# Patient Record
Sex: Female | Born: 1945 | Race: White | Hispanic: No | Marital: Married
Health system: Southern US, Community
[De-identification: ages and names within clinical notes are randomized; demographics above are authoritative.]

## PROBLEM LIST (undated history)

## (undated) DIAGNOSIS — M069 Rheumatoid arthritis, unspecified: Secondary | ICD-10-CM

## (undated) DIAGNOSIS — F0150 Vascular dementia without behavioral disturbance: Secondary | ICD-10-CM

## (undated) DIAGNOSIS — F32A Depression, unspecified: Secondary | ICD-10-CM

## (undated) DIAGNOSIS — E785 Hyperlipidemia, unspecified: Secondary | ICD-10-CM

## (undated) DIAGNOSIS — F419 Anxiety disorder, unspecified: Secondary | ICD-10-CM

## (undated) DIAGNOSIS — I69319 Unspecified symptoms and signs involving cognitive functions following cerebral infarction: Secondary | ICD-10-CM

## (undated) DIAGNOSIS — I10 Essential (primary) hypertension: Secondary | ICD-10-CM

## (undated) DIAGNOSIS — F329 Major depressive disorder, single episode, unspecified: Secondary | ICD-10-CM

---
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
## 2005-02-24 ENCOUNTER — Ambulatory Visit: Payer: Self-pay | Admitting: Family Medicine

## 2005-04-24 IMAGING — US ABDOMEN ULTRASOUND
1 series · 14 of 25 positions shown · non-contrast
Comparison: none

REASON FOR EXAM: Abdominal pain
COMMENTS:

[Series 1: abdomen ultrasound · 0.35mm/px · 14 of 54 slices shown]
[im 1/54]
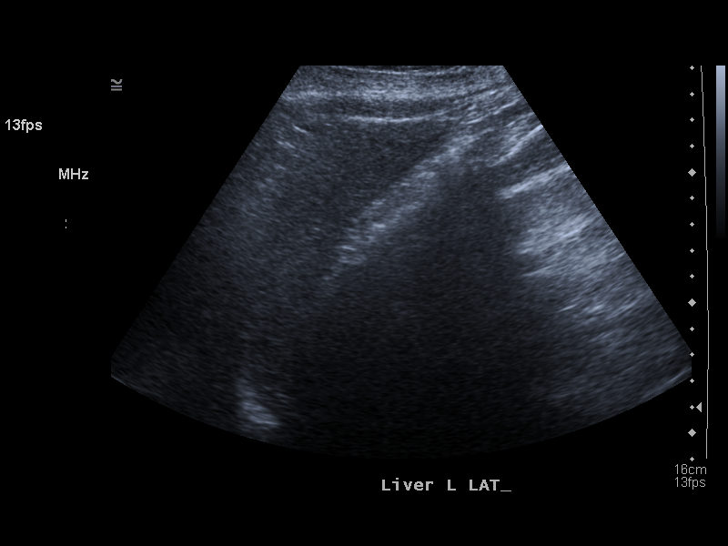
[im 5/54]
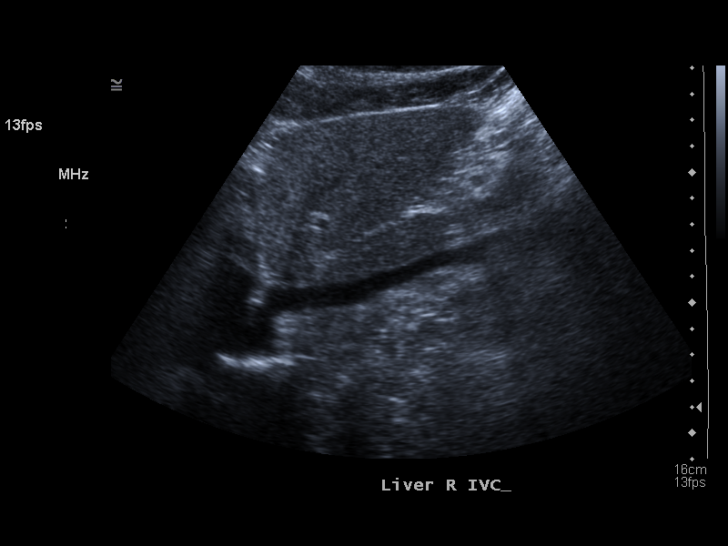
[im 9/54]
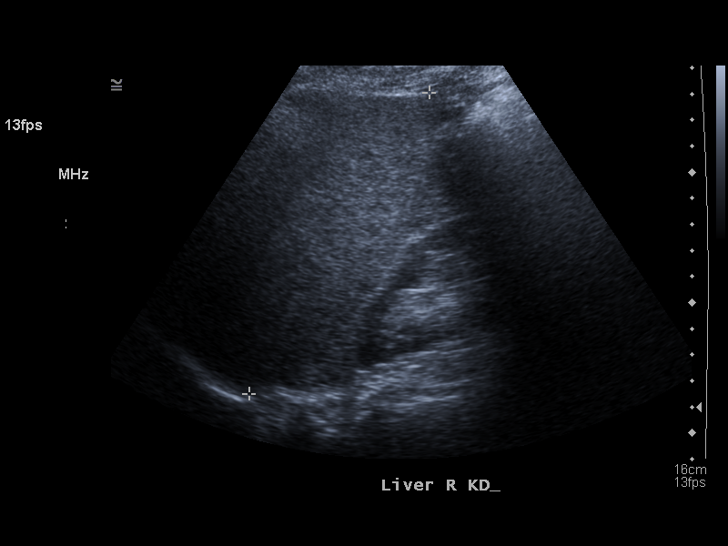
[im 14/54]
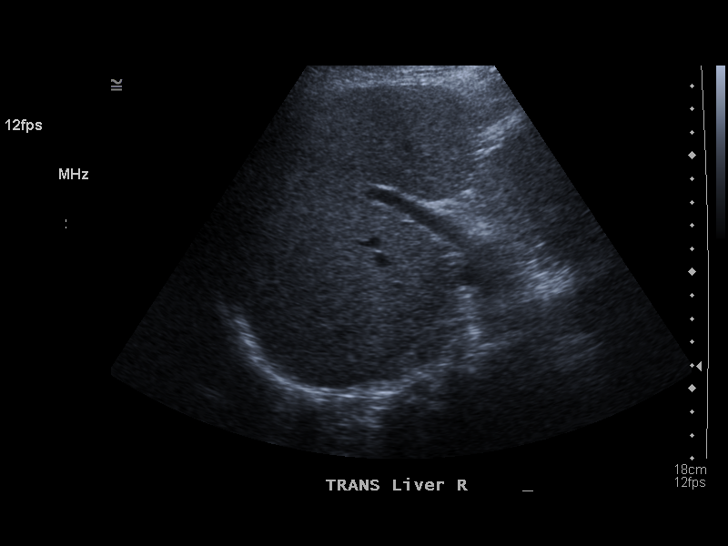
[im 18/54]
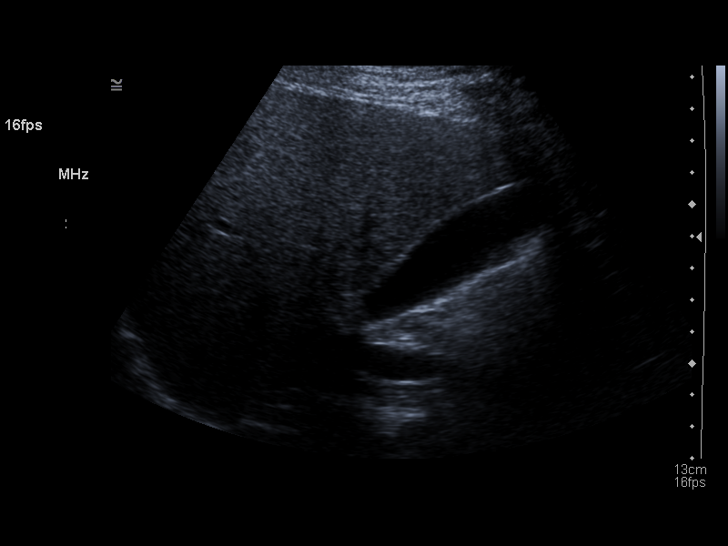
[im 20/54]
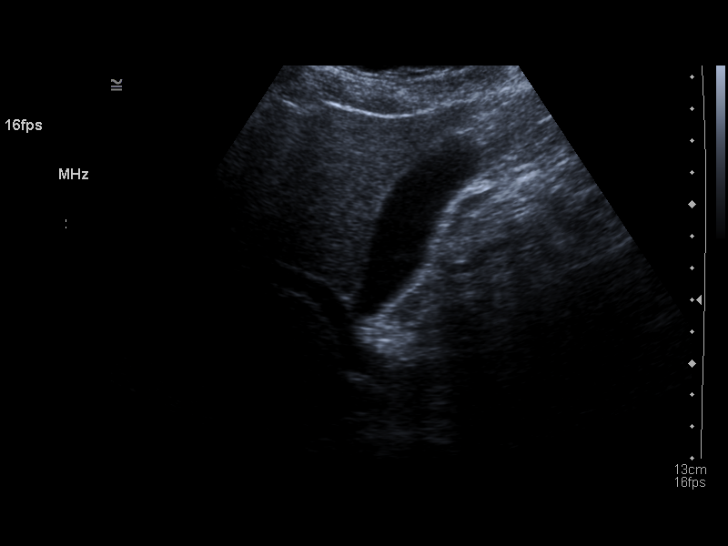
[im 25/54]
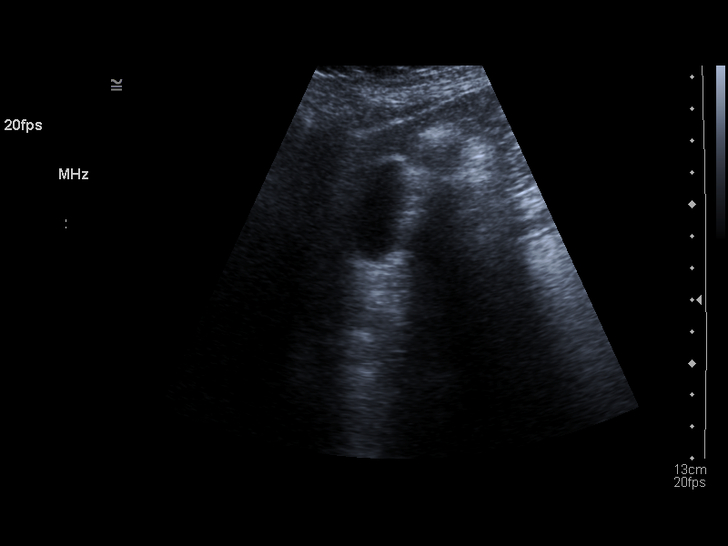
[im 29/54]
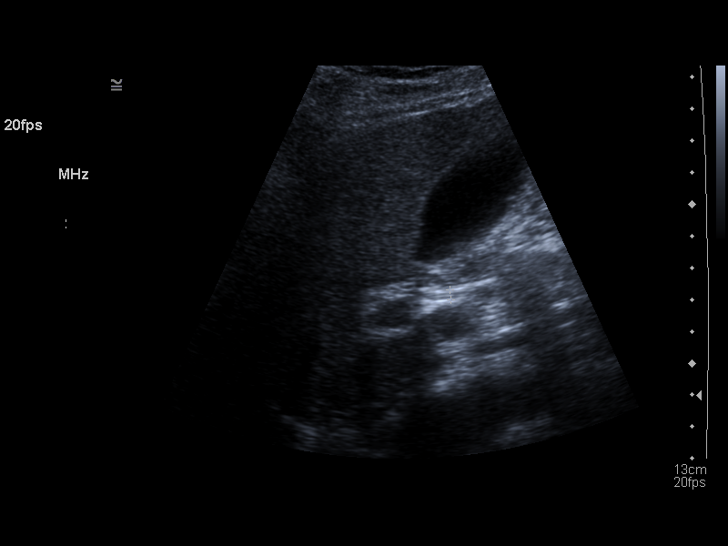
[im 34/54]
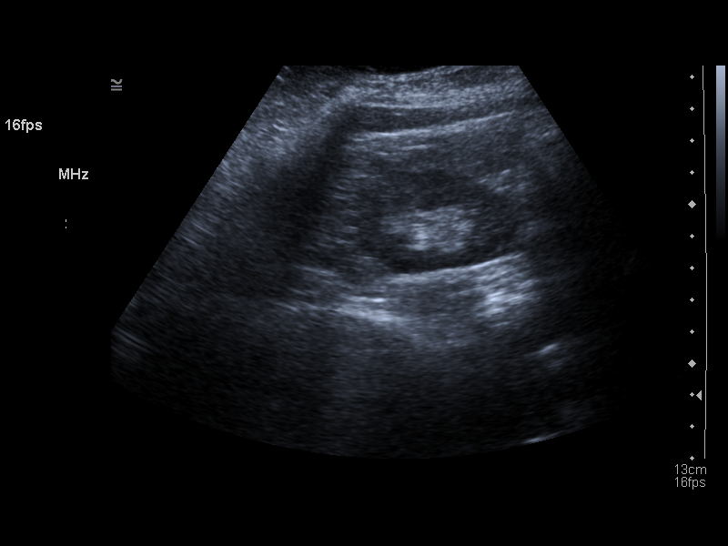
[im 36/54]
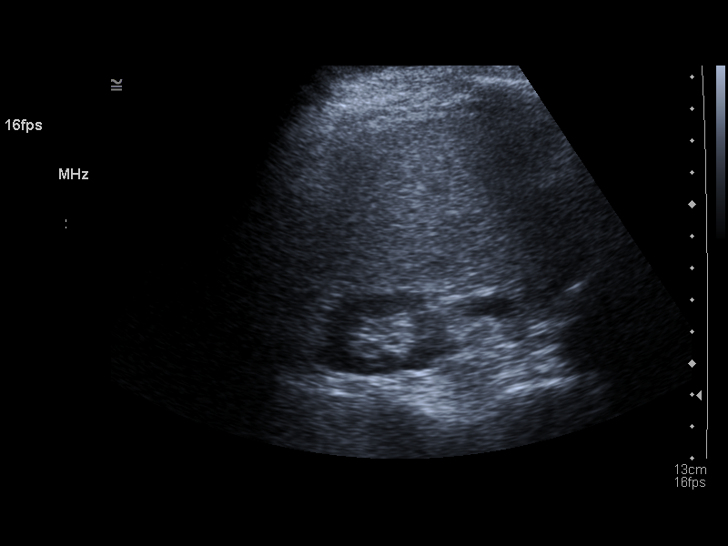
[im 40/54]
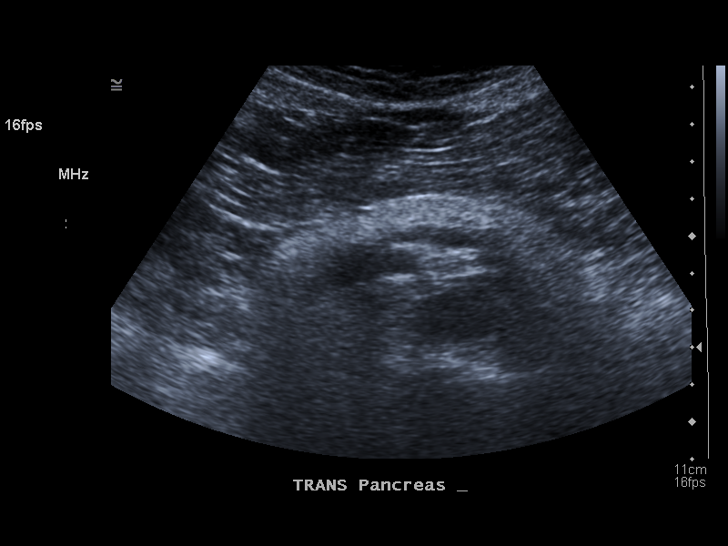
[im 45/54]
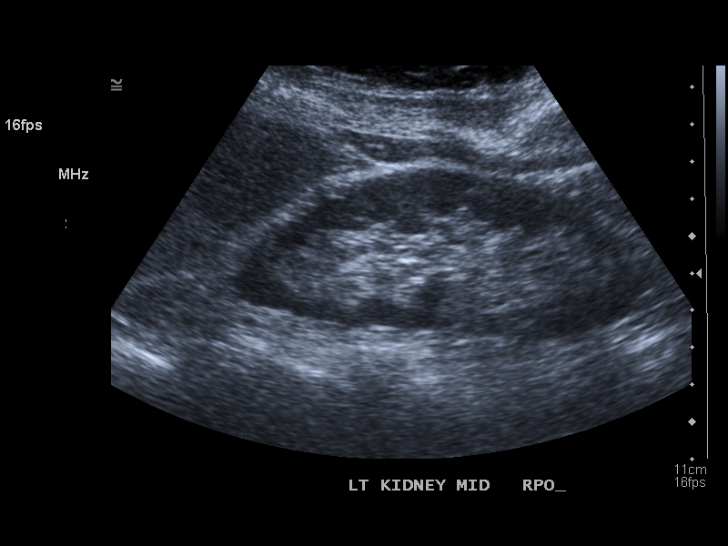
[im 49/54]
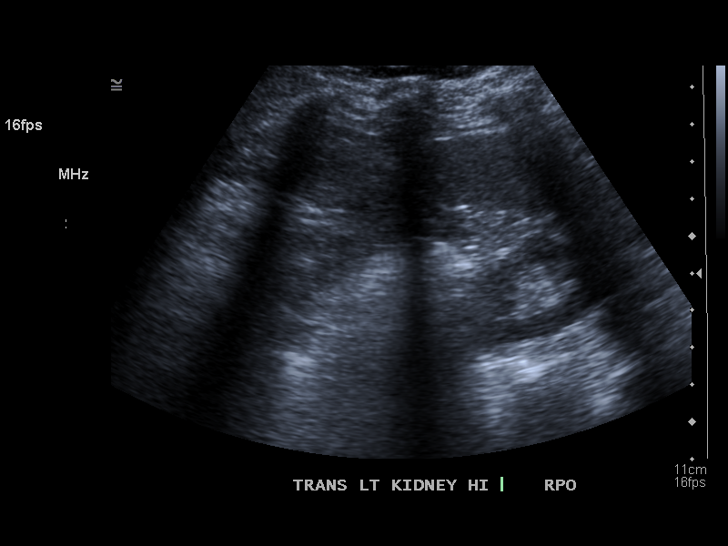
[im 54/54]
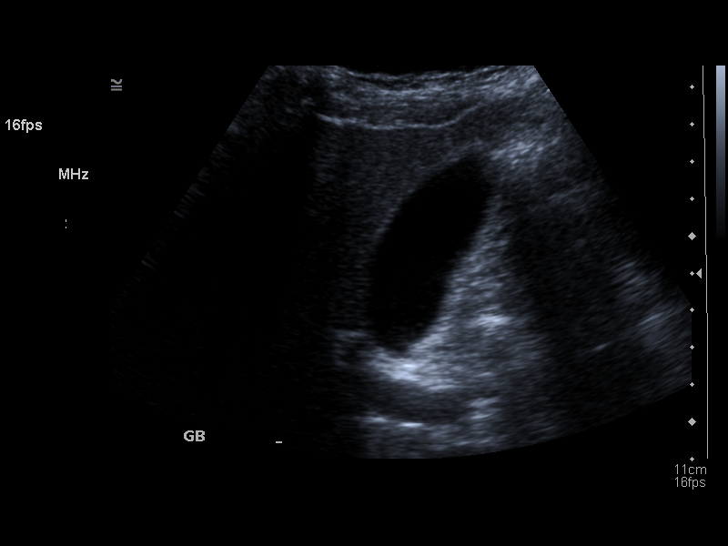

[14 of 25 positions shown; findings below may reference images not displayed]

PROCEDURE:     US  - US ABDOMEN GENERAL SURVEY  - February 24, 2005  [DATE]

RESULT:     The liver, spleen, and pancreas are normal in appearance.  No
gallstones are seen.  There is no thickening of the gallbladder wall.  The
common bile duct measures 3.8 mm in diameter which is within normal limits.
The kidneys show no hydronephrosis.  There is no ascites.
IMPRESSION: No significant abnormalities are noted.

## 2006-03-15 ENCOUNTER — Inpatient Hospital Stay: Payer: Self-pay | Admitting: Rheumatology

## 2006-09-07 ENCOUNTER — Ambulatory Visit: Payer: Self-pay | Admitting: Family Medicine

## 2006-09-21 ENCOUNTER — Ambulatory Visit: Payer: Self-pay | Admitting: Family Medicine

## 2006-09-25 ENCOUNTER — Ambulatory Visit: Payer: Self-pay | Admitting: Family Medicine

## 2006-11-02 ENCOUNTER — Ambulatory Visit: Payer: Self-pay | Admitting: General Surgery

## 2006-11-05 IMAGING — CT CT ABD-PELV W/ CM
1 of 2 series · 15 of 32 positions shown, 19 images · non-contrast
Comparison: none

REASON FOR EXAM: LLQ pain and Fever  call report [phone]  Pt appt at
[DATE] but in [DATE] spot
COMMENTS:

[Series 2: soft tissue · axial · 0.66mm/px · z∈[-900,-492]mm · 15 of 57 slices shown, 19 images]
[im 3/57  soft-tissue]
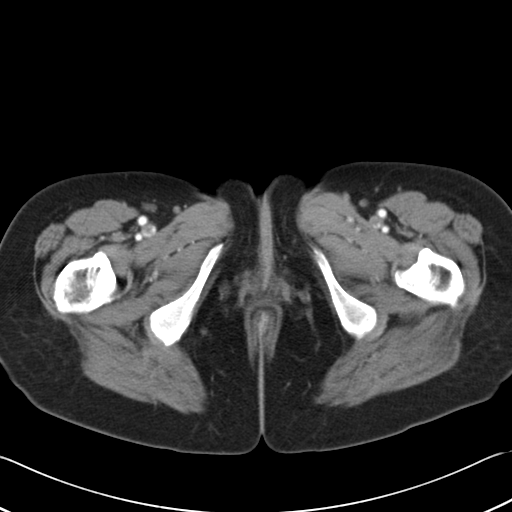
[im 3/57  bone]
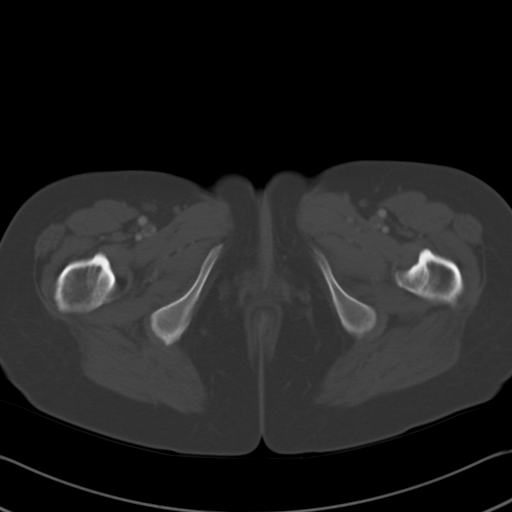
[im 8/57  soft-tissue]
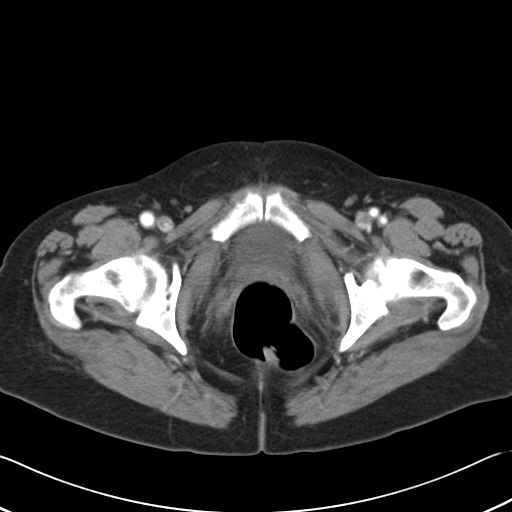
[im 13/57  soft-tissue]
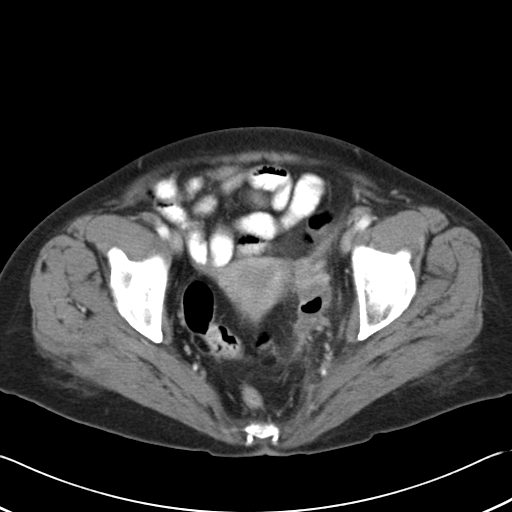
[im 16/57  soft-tissue]
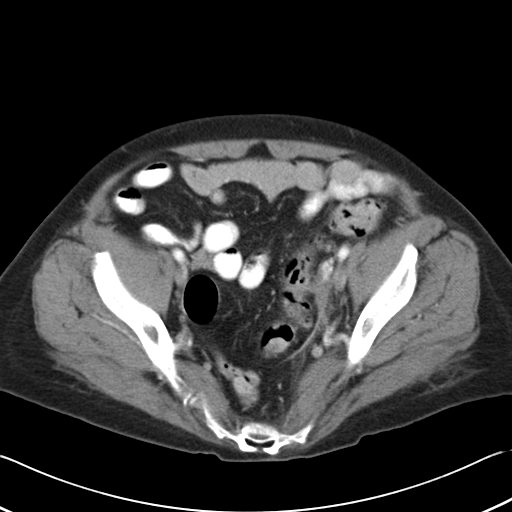
[im 21/57  soft-tissue]
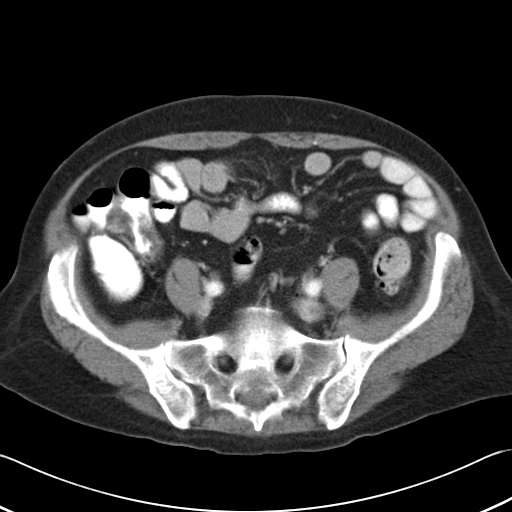
[im 23/57  soft-tissue]
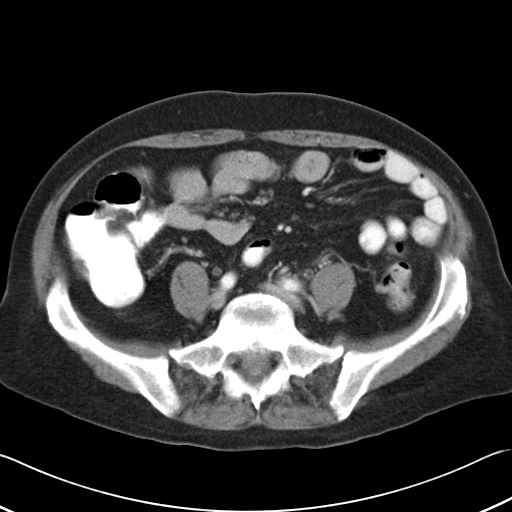
[im 29/57  soft-tissue]
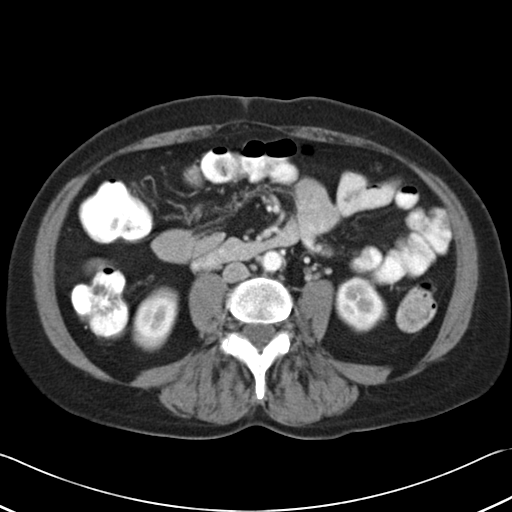
[im 34/57  soft-tissue]
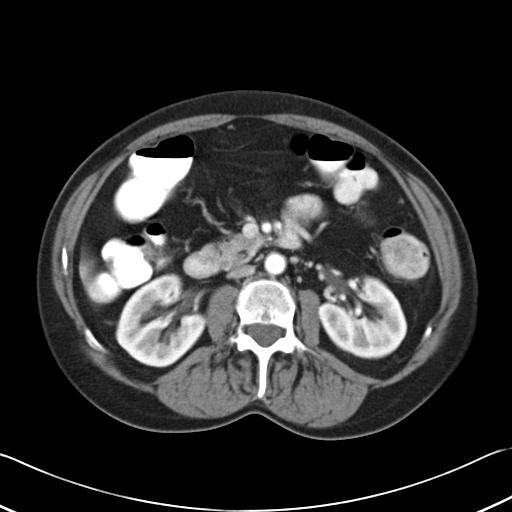
[im 36/57  soft-tissue]
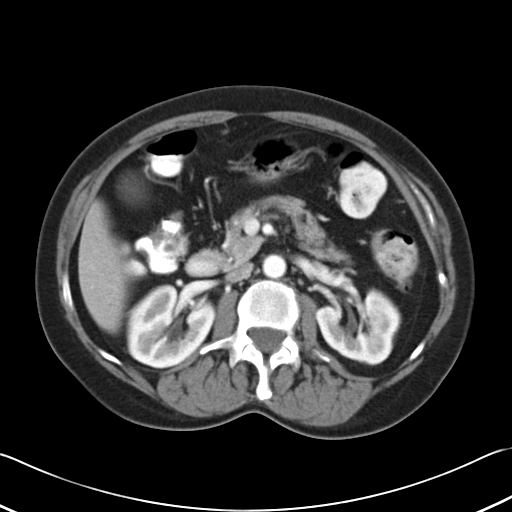
[im 36/57  bone]
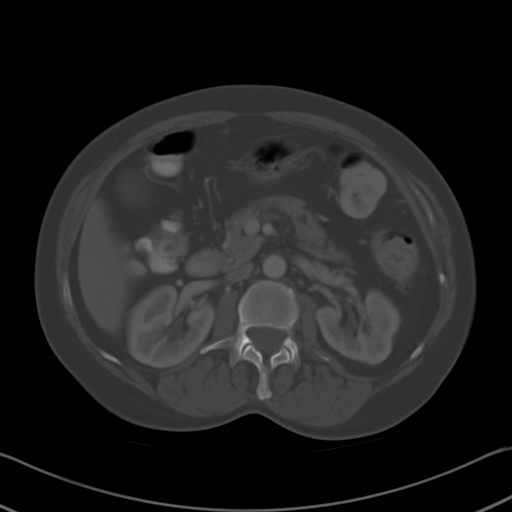
[im 41/57  soft-tissue]
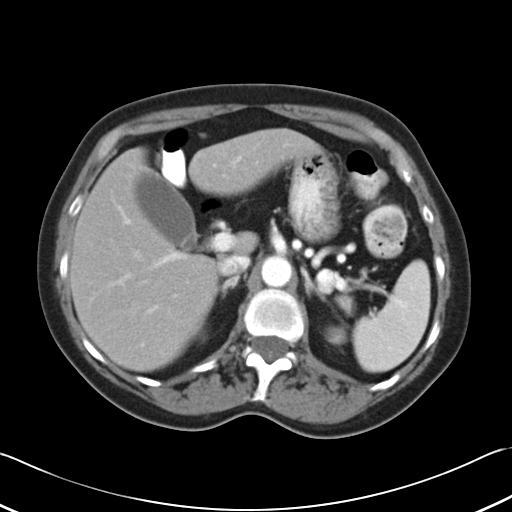
[im 44/57  soft-tissue]
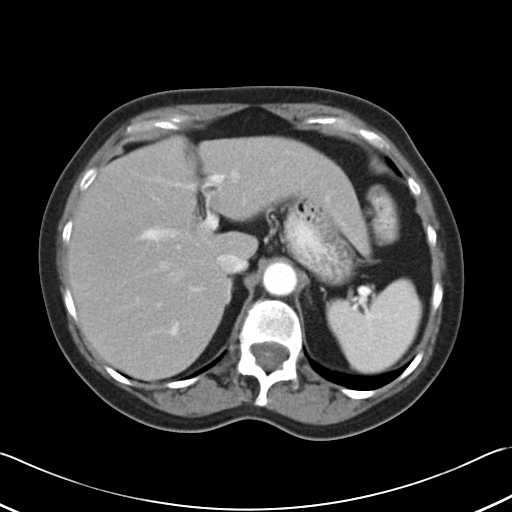
[im 46/57  lung]
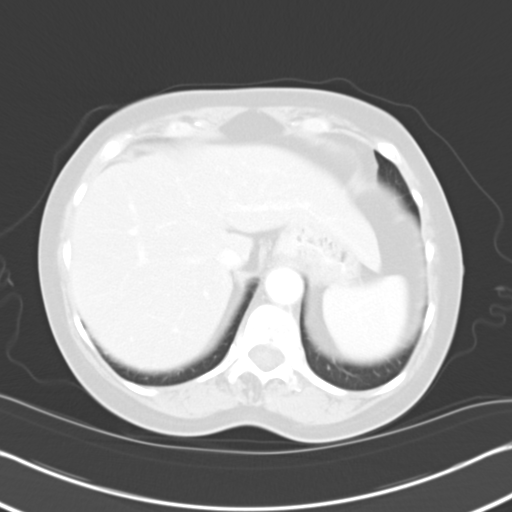
[im 49/57  soft-tissue]
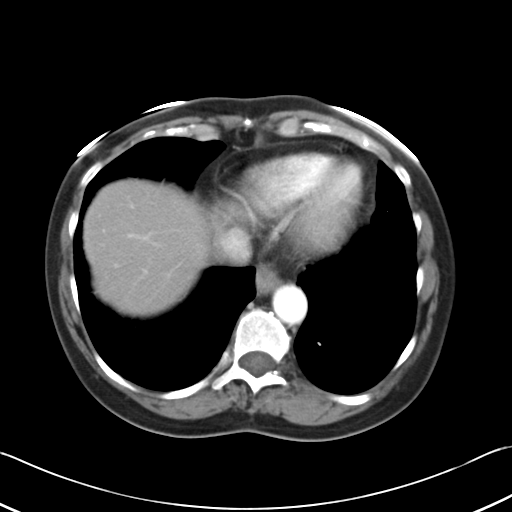
[im 49/57  lung]
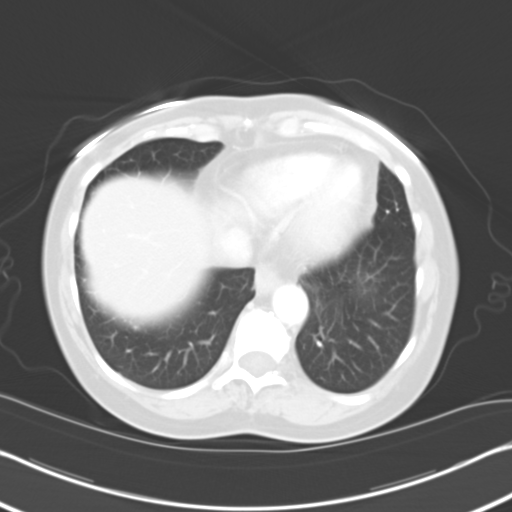
[im 51/57  lung]
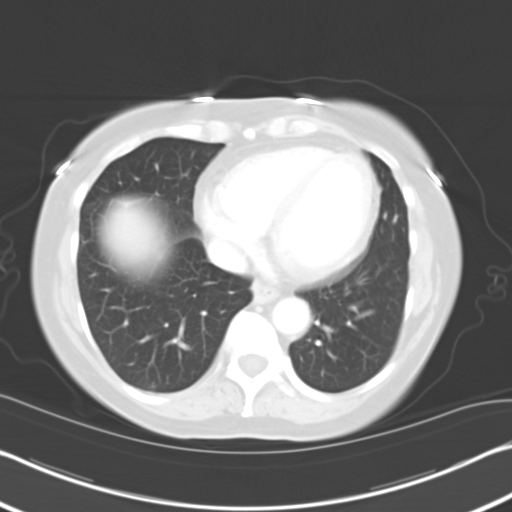
[im 54/57  soft-tissue]
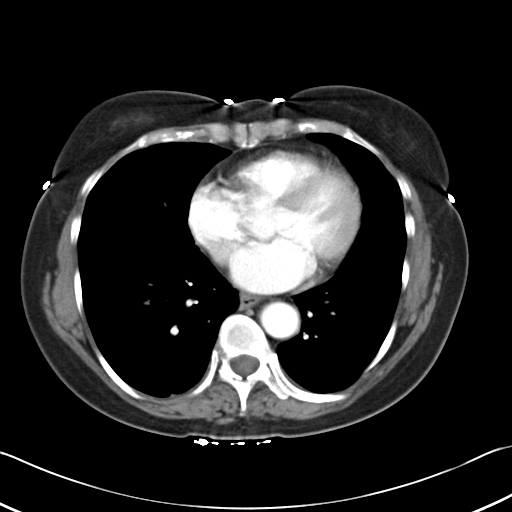
[im 54/57  lung]
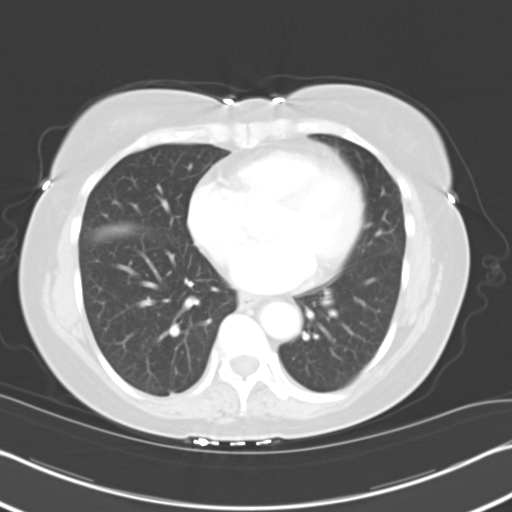

[15 of 32 positions shown; findings below may reference images not displayed]

PROCEDURE:     CT  - CT ABDOMEN / PELVIS  W  - September 07, 2006  [DATE]

RESULT:     The patient complaining of LEFT lower quadrant discomfort and
has fever. The patient received 100 ml of [id number]. The liver exhibits
normal density with no evidence of a mass or ductal dilation. The
gallbladder is adequately distended with no evidence of calcified stones.
The pancreas, adrenal glands, spleen, and stomach exhibit no acute
abnormality. The kidneys enhance well with no evidence of obstruction.

The partially contrast filled loops of small bowel are normal in appearance.
There is some prominence at the region of the ileocecal valve but no
evidence of obstruction is seen and there is no surrounding inflammatory
change. Within the pelvis, however,  there is some thickening of the sigmoid
colonic wall and there may be a very small fluid collection adjacent to the
colon.  The possibility of there being contained perforation is raised as
well given the appearance of a gas collection measuring approximately 1.5 x
1.0 cm seen on images 44 and 45. There is no free fluid in the pelvis. The
urinary bladder is partially distended and grossly normal. The uterus is
normal in size. No definite adnexal mass is seen. The lung bases are clear.
IMPRESSION: 1. There are findings worrisome for sigmoid diverticulitis. There may be a
small diverticular abscess containing fluid and air. The overall dimensions
of this would not exceed 3.0 cm.
2. There is no evidence of bowel obstruction.
3. I see no evidence of acute hepatobiliary disease.
4.  Dr. Fernanda Tung was paged with this report at [DATE] p.m. on [DATE]

## 2006-11-19 IMAGING — CT CT ABD-PELV W/ CM
1 of 2 series · 15 of 32 positions shown, 19 images · non-contrast
Comparison: none

REASON FOR EXAM: diverticulitis
COMMENTS:

[Series 2: abdomen · axial · 0.64mm/px · z∈[-438,-54]mm · 15 of 54 slices shown, 19 images]
[im 3/54  soft-tissue]
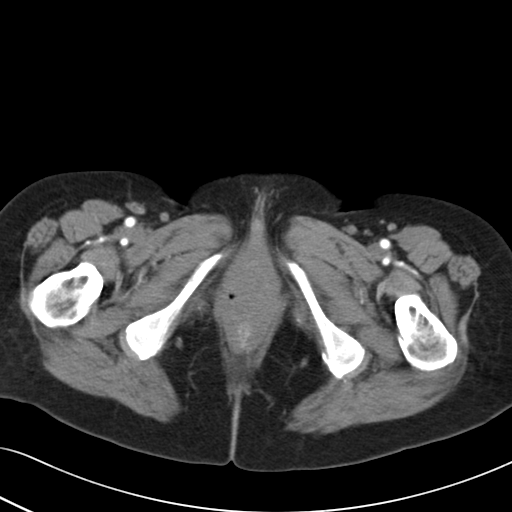
[im 3/54  bone]
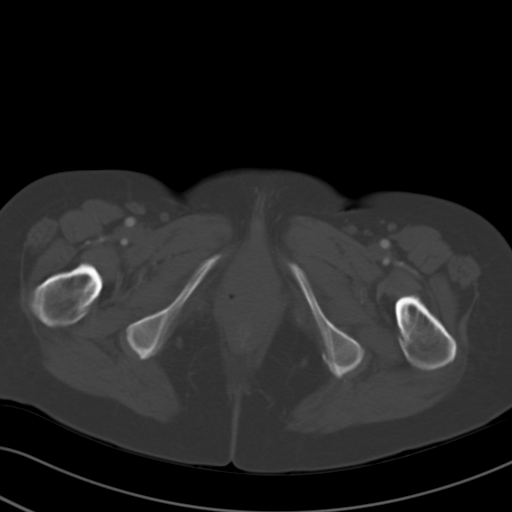
[im 7/54  soft-tissue]
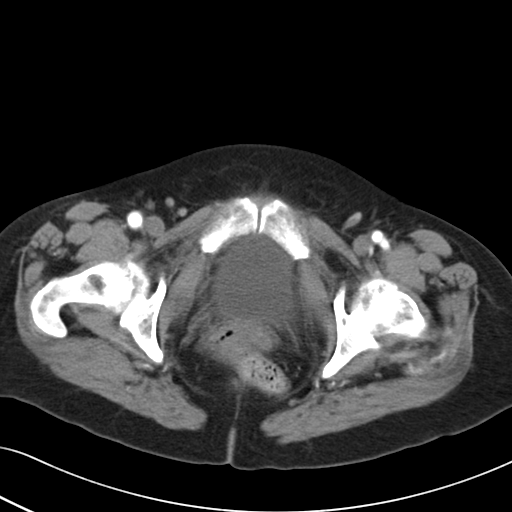
[im 12/54  soft-tissue]
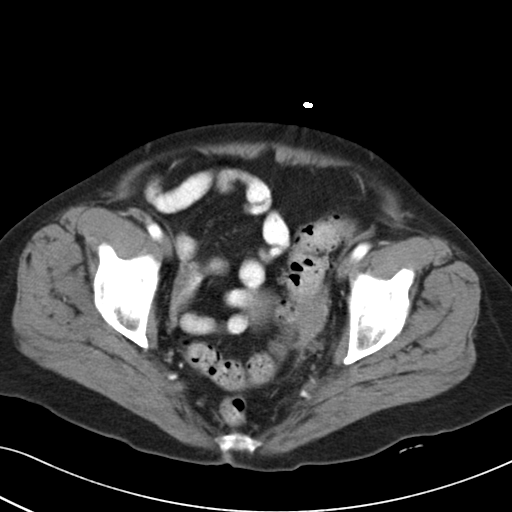
[im 16/54  soft-tissue]
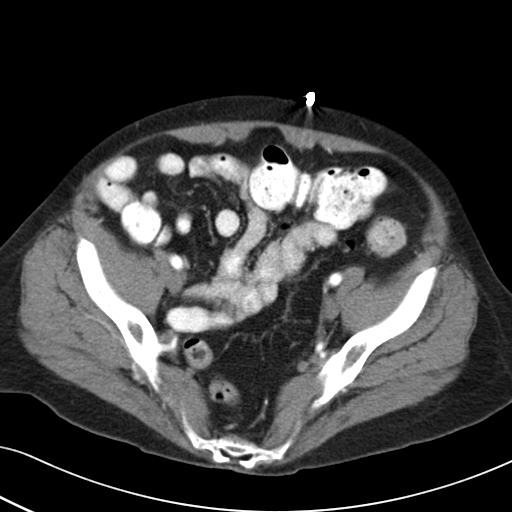
[im 18/54  soft-tissue]
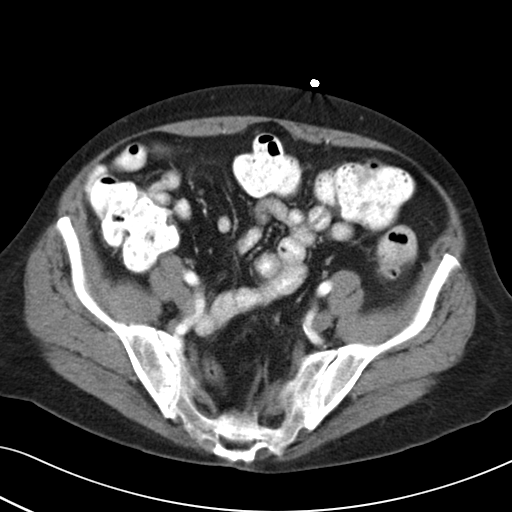
[im 23/54  soft-tissue]
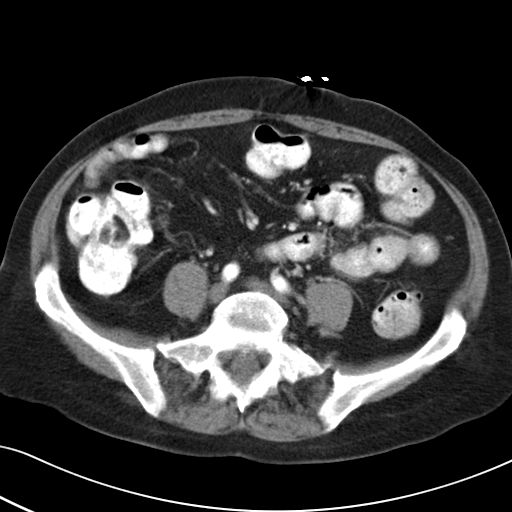
[im 27/54  soft-tissue]
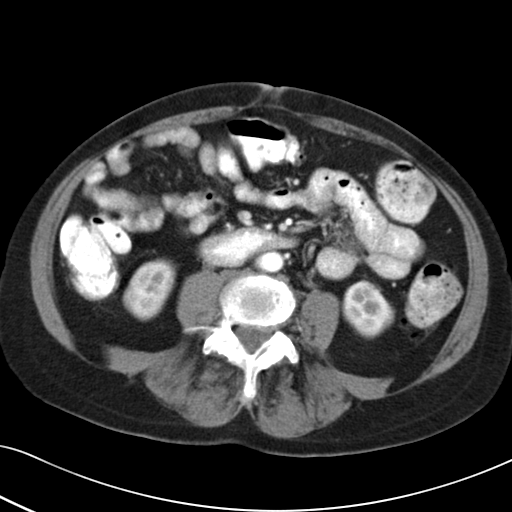
[im 31/54  soft-tissue]
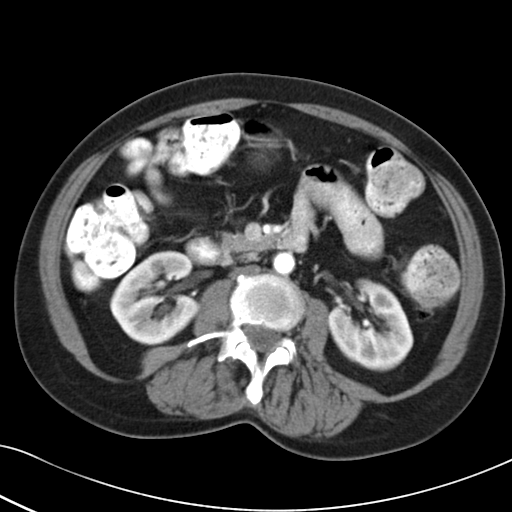
[im 36/54  soft-tissue]
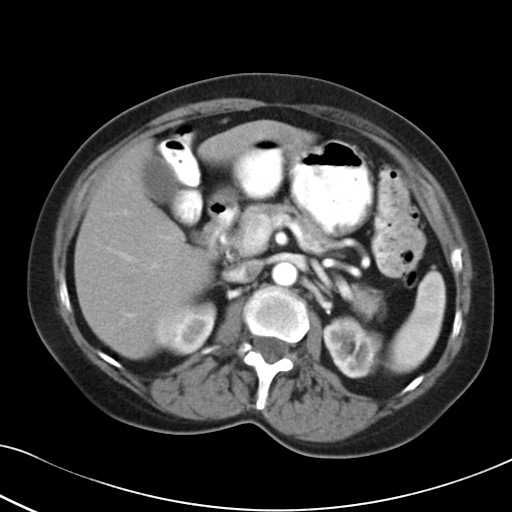
[im 36/54  bone]
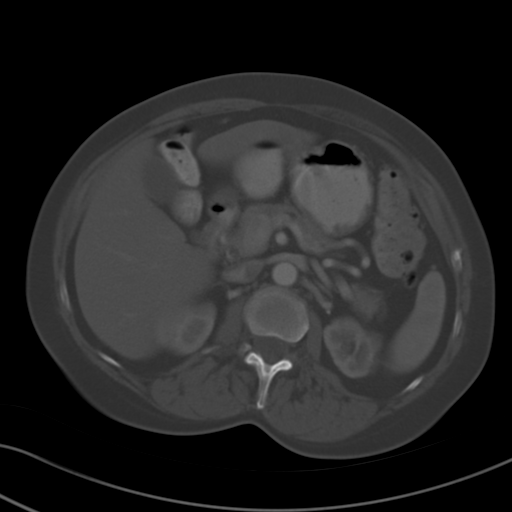
[im 38/54  soft-tissue]
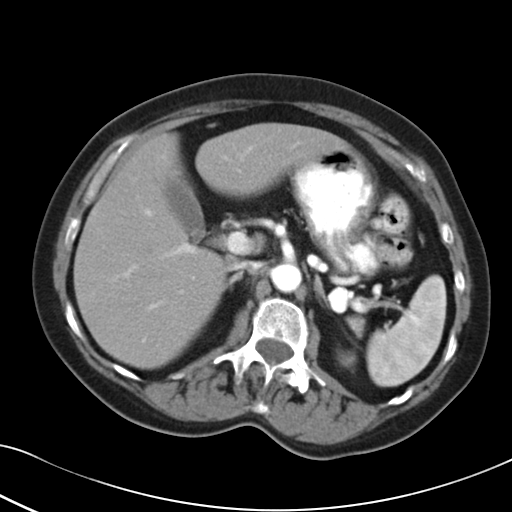
[im 42/54  soft-tissue]
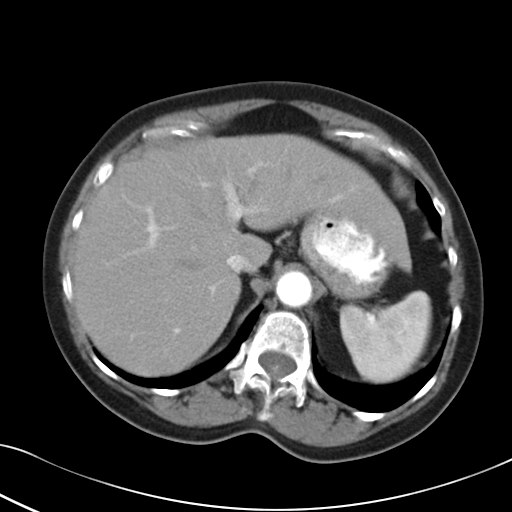
[im 45/54  lung]
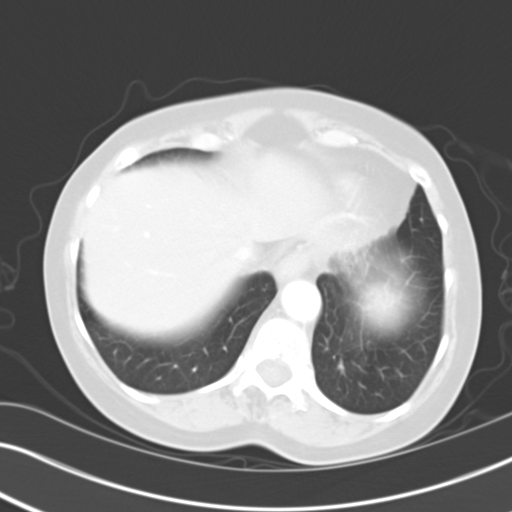
[im 47/54  soft-tissue]
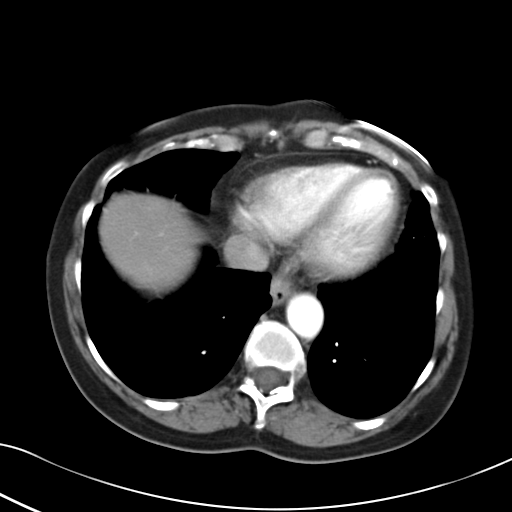
[im 47/54  lung]
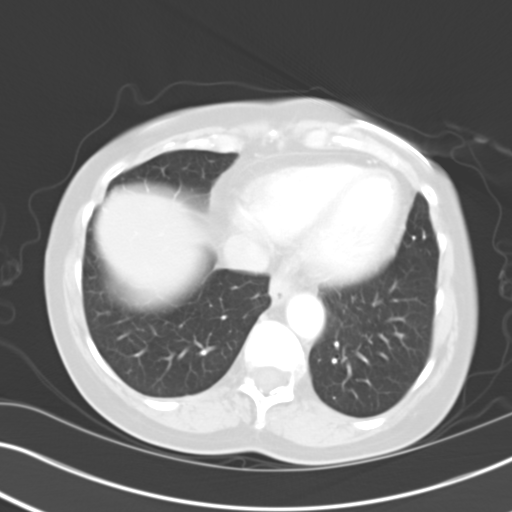
[im 49/54  lung]
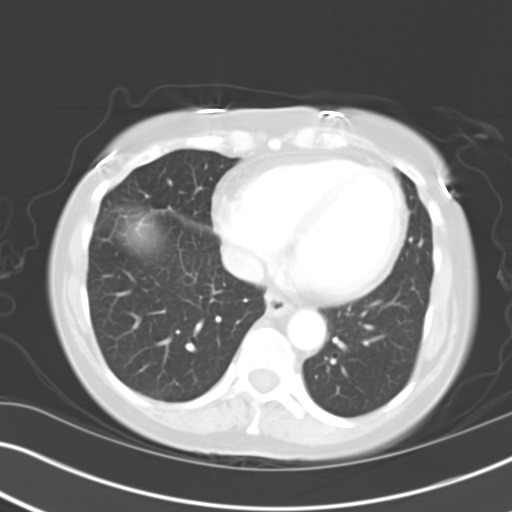
[im 51/54  soft-tissue]
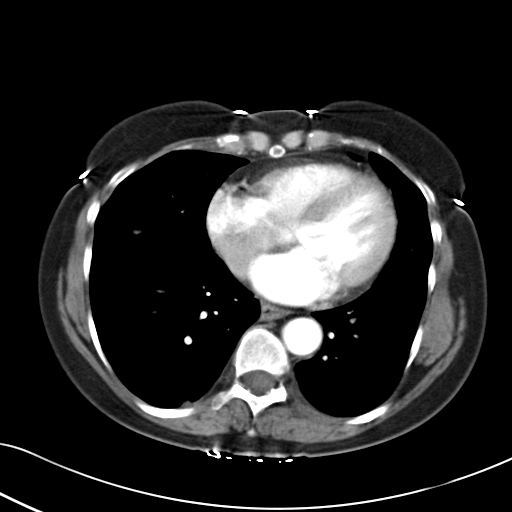
[im 51/54  lung]
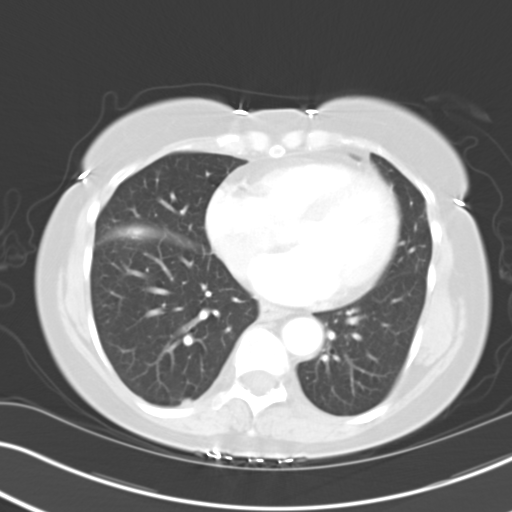

[15 of 32 positions shown; findings below may reference images not displayed]

PROCEDURE:     CT  - CT ABDOMEN / PELVIS  W  - September 21, 2006  [DATE]

RESULT:     Comparison is made to the prior examination performed on
09/07/2006.

Today's IV and oral contrast-enhanced CT demonstrates an elliptical 2.6 x
0.7 cm area along the LEFT lateral aspect of the sigmoid colon which could
represent developing abscess or minimal early abscess secondary to sigmoid
diverticulitis suggested on the prior study where there was a small
air-fluid level. A resolving abscess could also be considered. Hounsfield
readings in this structure are approximately 46, which could be secondary to
proteinaceous fluid. The liver and spleen appear to enhance normally. The
gallbladder shows no evidence of gallstones. The kidneys, pancreas, adrenal
glands and aorta appear to be normal. There is no abnormal bowel distention.
No free air or free fluid is seen. The uterus is present and shows evidence
of atrophy.
IMPRESSION: 1.     A small abscess along the left lateral aspect of the sigmoid colon
likely the sequela of diverticulitis. Continued follow up is recommended.
2.     Significant diverticulosis as described.

## 2007-01-18 ENCOUNTER — Ambulatory Visit: Payer: Self-pay | Admitting: General Surgery

## 2007-01-22 ENCOUNTER — Ambulatory Visit: Payer: Self-pay | Admitting: General Surgery

## 2008-01-15 ENCOUNTER — Emergency Department: Payer: Self-pay | Admitting: Internal Medicine

## 2008-01-15 ENCOUNTER — Other Ambulatory Visit: Payer: Self-pay

## 2008-03-14 IMAGING — CT CT HEAD WITHOUT CONTRAST
2 series · 16 of 30 positions shown, 20 images · non-contrast
Comparison: none

REASON FOR EXAM: dizziness  rm 9
COMMENTS:

[Series 2: without · axial · non-contrast · 0.39mm/px · z∈[+398,+518]mm · 13 of 28 slices shown, 17 images]
[im 2/28  brain]
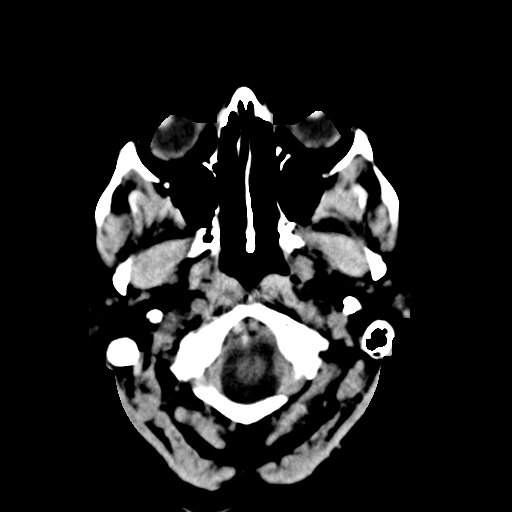
[im 2/28  bone]
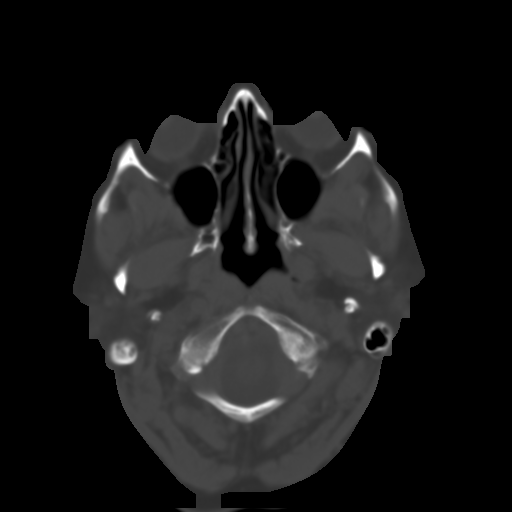
[im 4/28  brain]
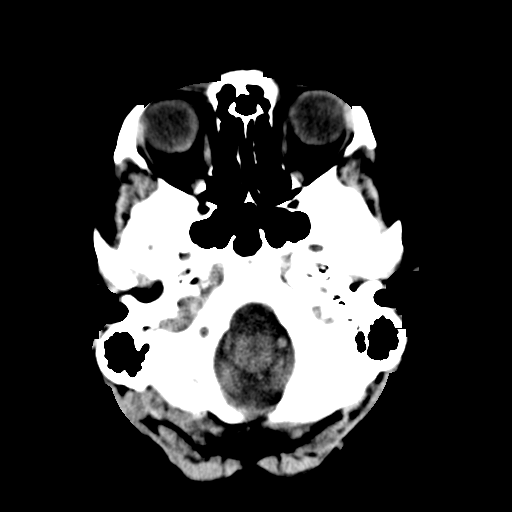
[im 6/28  brain]
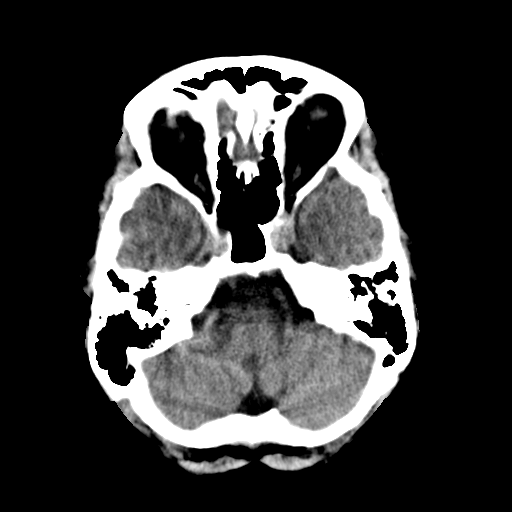
[im 8/28  brain]
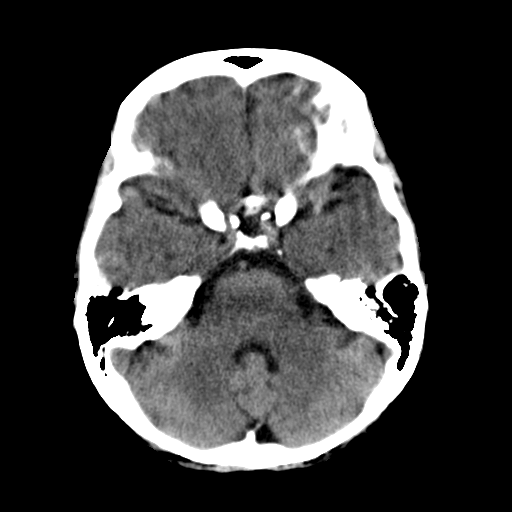
[im 10/28  brain]
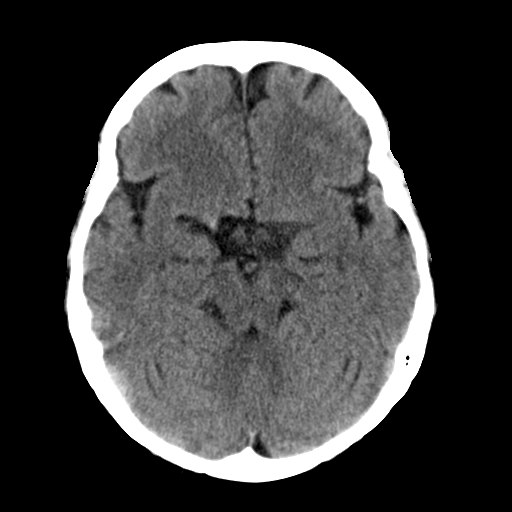
[im 10/28  bone]
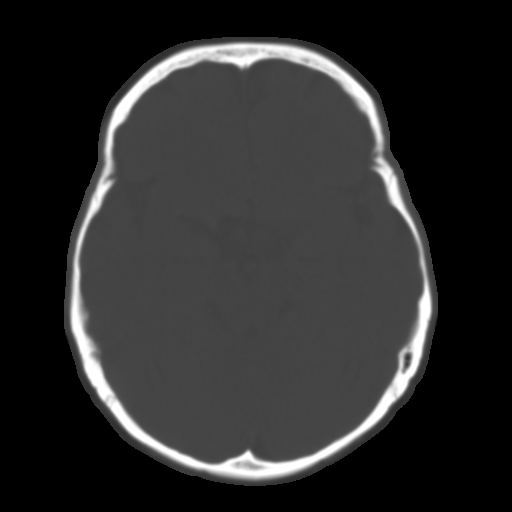
[im 12/28  brain]
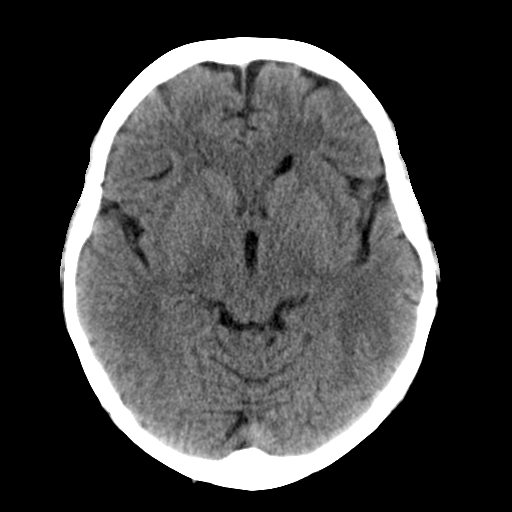
[im 14/28  brain]
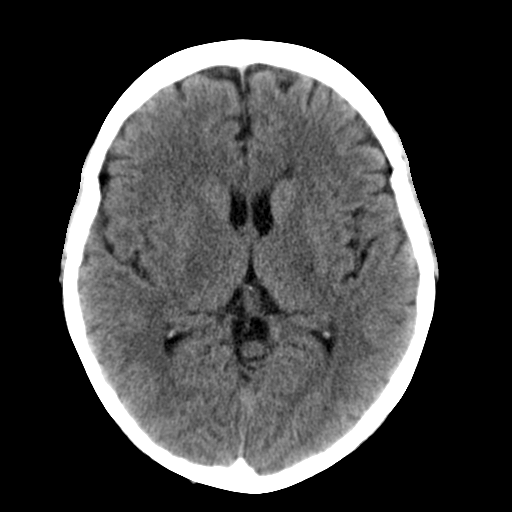
[im 16/28  brain]
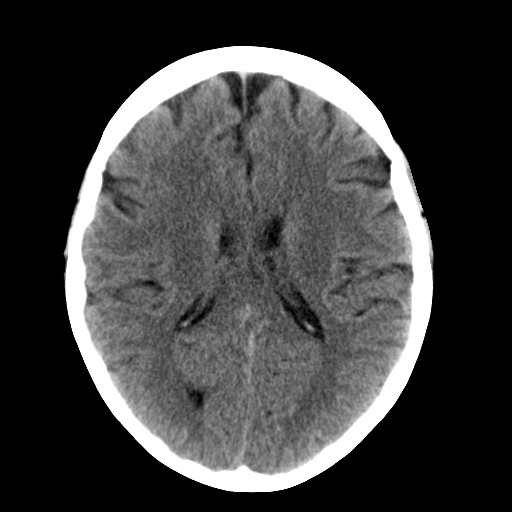
[im 18/28  brain]
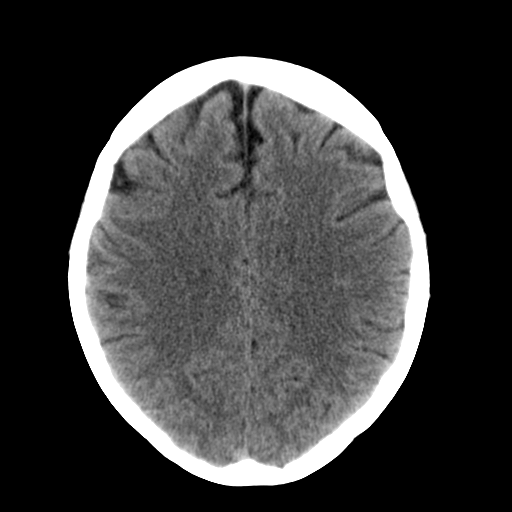
[im 18/28  bone]
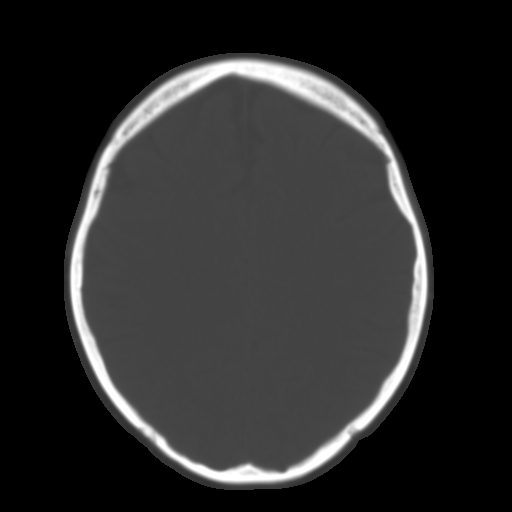
[im 20/28  brain]
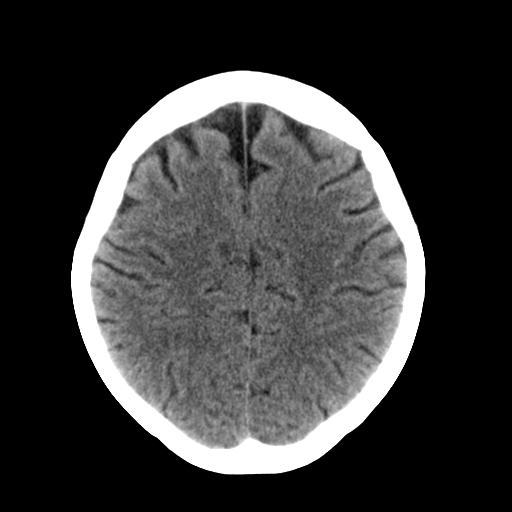
[im 22/28  brain]
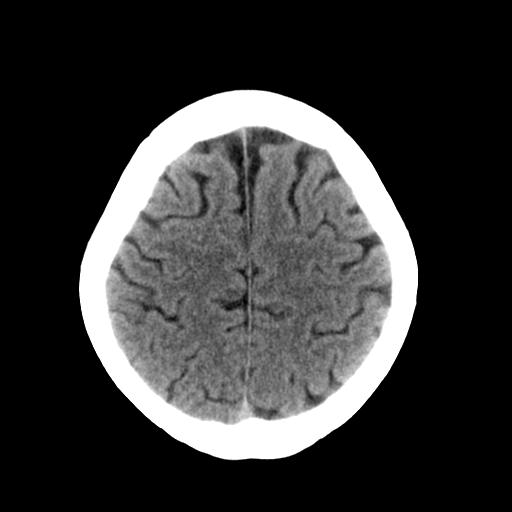
[im 24/28  brain]
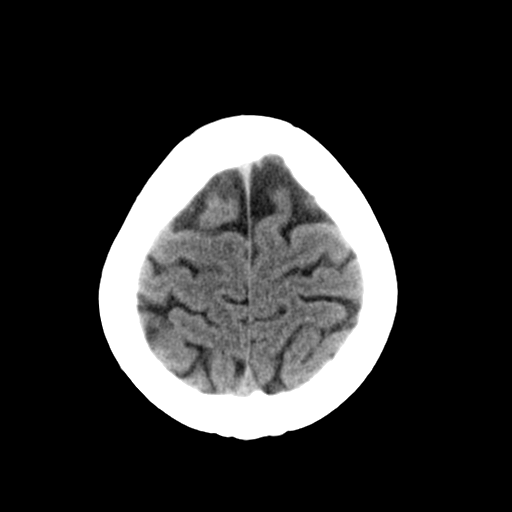
[im 26/28  brain]
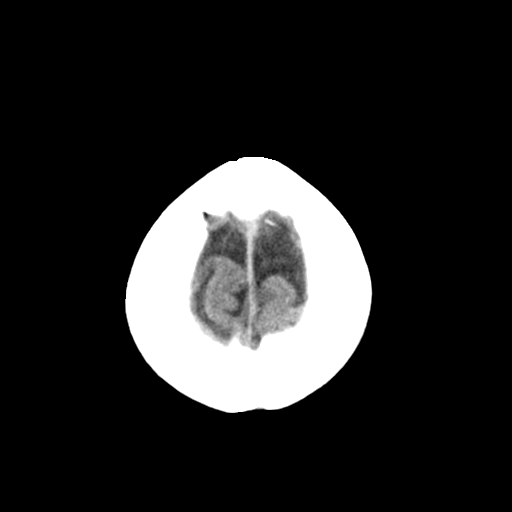
[im 26/28  bone]
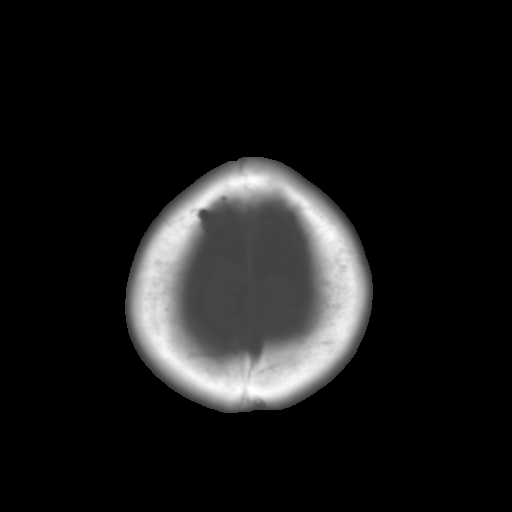

[Series 3: bone · axial · 0.39mm/px · z∈[+398,+438]mm · 3 of 28 slices shown]
[im 2/28  bone]
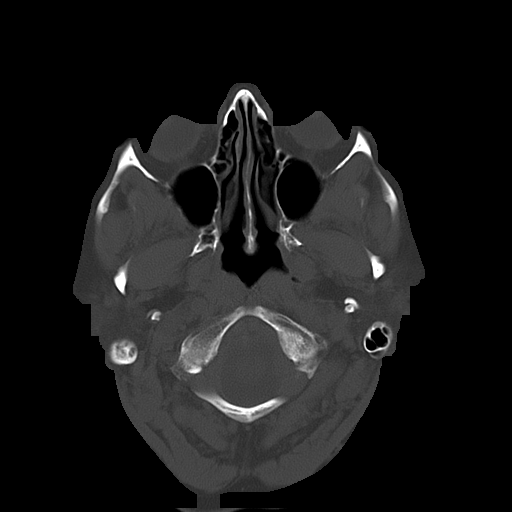
[im 6/28  bone]
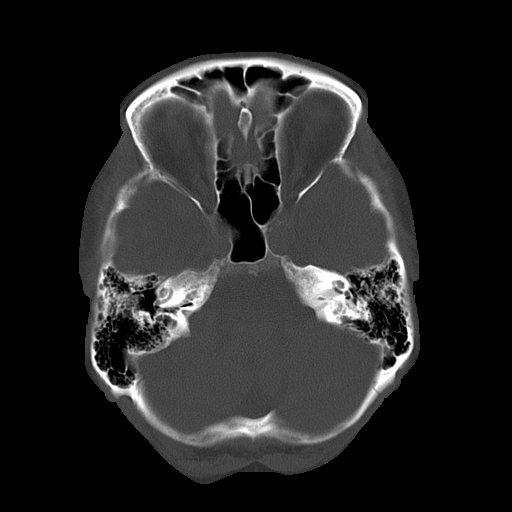
[im 10/28  bone]
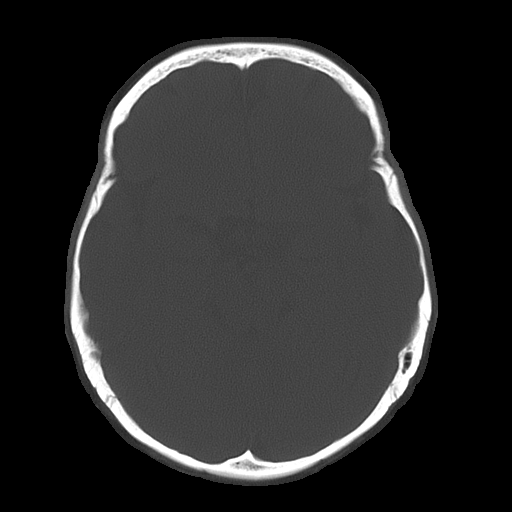

[16 of 30 positions shown; findings below may reference images not displayed]

PROCEDURE:     CT  - CT HEAD WITHOUT CONTRAST  - January 15, 2008 [DATE]

RESULT:     There is no evidence of intra-axial or extra-axial fluid
collections or evidence of acute hemorrhage.  No secondary signs are
appreciated to suggest mass effect, focal subacute or focal chronic
infarction.  The visualized bony skeleton demonstrates no evidence of
fracture or dislocation.
IMPRESSION: 1.     Unremarkable Head CT as described above.
2.     Dr. Alladee of the Emergency Department was informed of these
findings at the time of the initial interpretation.

## 2010-05-17 ENCOUNTER — Ambulatory Visit: Payer: Self-pay

## 2010-06-01 ENCOUNTER — Ambulatory Visit: Payer: Self-pay

## 2011-12-01 ENCOUNTER — Ambulatory Visit: Payer: Self-pay | Admitting: Obstetrics and Gynecology

## 2012-12-02 ENCOUNTER — Ambulatory Visit: Payer: Self-pay | Admitting: Obstetrics and Gynecology

## 2015-10-04 ENCOUNTER — Other Ambulatory Visit: Payer: Self-pay | Admitting: Family Medicine

## 2015-10-04 DIAGNOSIS — Z1231 Encounter for screening mammogram for malignant neoplasm of breast: Secondary | ICD-10-CM

## 2015-10-13 ENCOUNTER — Other Ambulatory Visit: Payer: Self-pay | Admitting: Family Medicine

## 2015-10-13 ENCOUNTER — Ambulatory Visit
Admission: RE | Admit: 2015-10-13 | Discharge: 2015-10-13 | Disposition: A | Discharge status: Home / Self Care | Payer: Medicare Other | Source: Ambulatory Visit | Attending: Family Medicine | Admitting: Family Medicine

## 2015-10-13 DIAGNOSIS — Z1231 Encounter for screening mammogram for malignant neoplasm of breast: Secondary | ICD-10-CM | POA: Insufficient documentation

## 2016-04-03 ENCOUNTER — Other Ambulatory Visit: Payer: Self-pay | Admitting: Family Medicine

## 2016-04-03 DIAGNOSIS — R41840 Attention and concentration deficit: Secondary | ICD-10-CM

## 2016-04-03 DIAGNOSIS — R413 Other amnesia: Secondary | ICD-10-CM

## 2016-04-06 ENCOUNTER — Ambulatory Visit
Admission: RE | Admit: 2016-04-06 | Discharge: 2016-04-06 | Disposition: A | Discharge status: Home / Self Care | Payer: Medicare Other | Source: Ambulatory Visit | Attending: Family Medicine | Admitting: Family Medicine

## 2016-04-06 DIAGNOSIS — R413 Other amnesia: Secondary | ICD-10-CM | POA: Diagnosis not present

## 2016-04-06 DIAGNOSIS — G9389 Other specified disorders of brain: Secondary | ICD-10-CM | POA: Insufficient documentation

## 2016-04-06 DIAGNOSIS — Z8673 Personal history of transient ischemic attack (TIA), and cerebral infarction without residual deficits: Secondary | ICD-10-CM | POA: Diagnosis not present

## 2016-04-06 DIAGNOSIS — I651 Occlusion and stenosis of basilar artery: Secondary | ICD-10-CM | POA: Diagnosis not present

## 2016-04-06 DIAGNOSIS — R41840 Attention and concentration deficit: Secondary | ICD-10-CM | POA: Insufficient documentation

## 2016-04-06 DIAGNOSIS — G319 Degenerative disease of nervous system, unspecified: Secondary | ICD-10-CM | POA: Insufficient documentation

## 2016-06-04 IMAGING — MR MR HEAD W/O CM
9 of 10 series · 40 of 48 positions shown · non-contrast
Comparison: 01/15/2008 CT.  No comparison MR BATTLE

CLINICAL DATA: 69-year-old female with short-term memory loss and
difficulty concentrating for the past 6 months. Initial encounter.

EXAM:
MRI HEAD WITHOUT CONTRAST
TECHNIQUE: Multiplanar, multiecho pulse sequences of the brain and surrounding
structures were obtained without intravenous contrast.

[Series 2: T1 · sagittal · 5.0mm · 0.45mm/px · 3 of 28 slices shown]
[im 1/28]
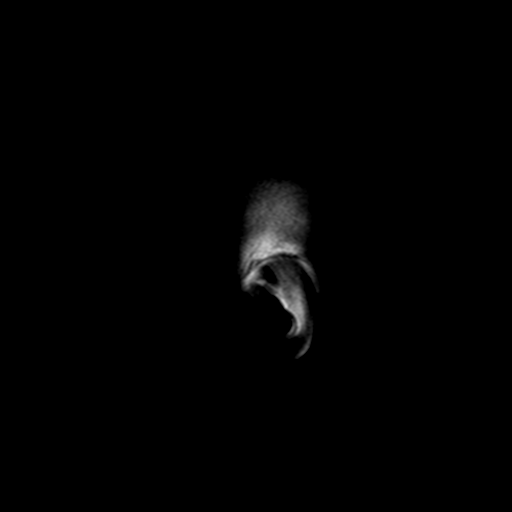
[im 14/28]
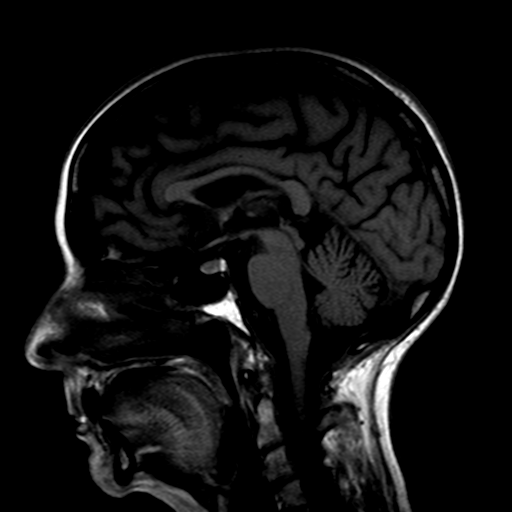
[im 28/28]
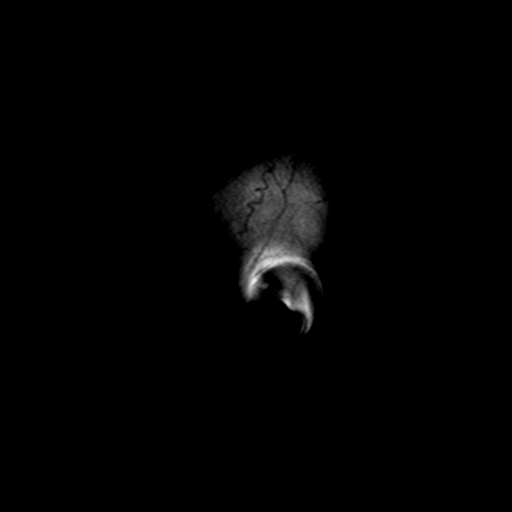

[Series 4: DWI · axial · 4.0mm · 0.94mm/px · z∈[-38,+125]mm · 6 of 42 slices shown (1 of 4)]
[im 1/42]
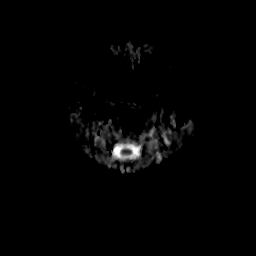
[im 9/42]
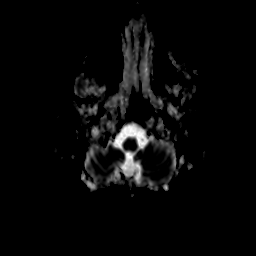
[im 17/42]
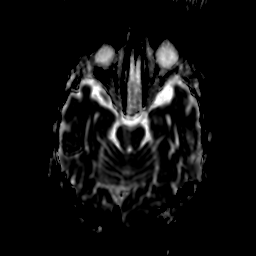
[im 25/42]
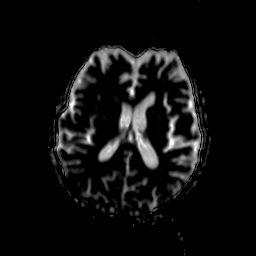
[im 33/42]
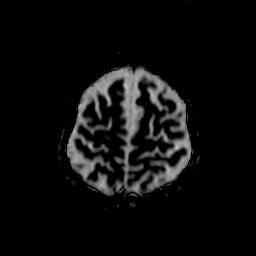
[im 42/42]
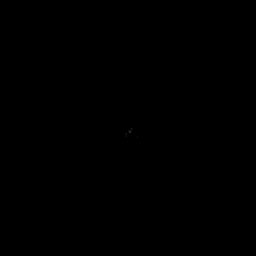

[Series 6: DWI · coronal · 5.0mm · 1.80mm/px · 5 of 38 slices shown (2 of 4)]
[im 1/38]
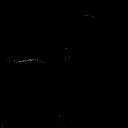
[im 10/38]
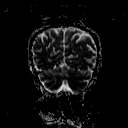
[im 19/38]
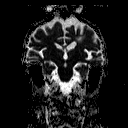
[im 28/38]
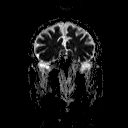
[im 38/38]
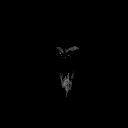

[Series 7: DWI · axial · 4.0mm · 0.94mm/px · z∈[-10,+121]mm · 5 of 34 slices shown (3 of 4)]
[im 1/34]
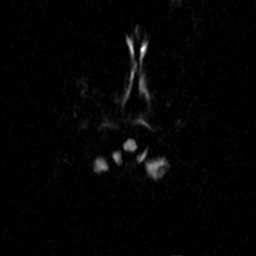
[im 9/34]
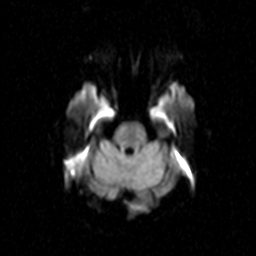
[im 17/34]
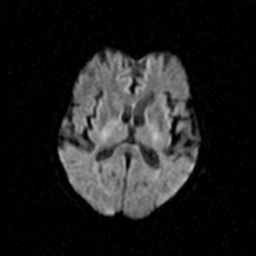
[im 25/34]
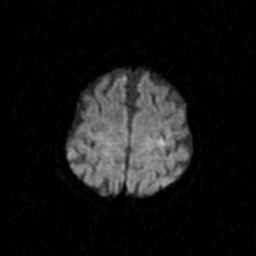
[im 34/34]
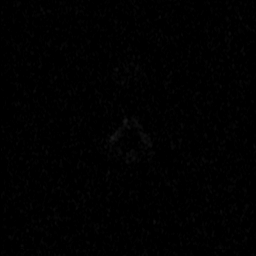

[Series 8: DWI · coronal · 5.0mm · 1.80mm/px · 5 of 35 slices shown (4 of 4)]
[im 1/35]
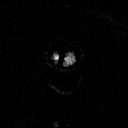
[im 9/35]
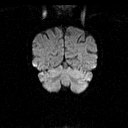
[im 18/35]
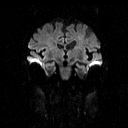
[im 26/35]
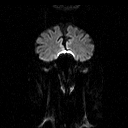
[im 35/35]
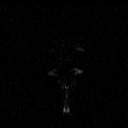

[Series 9: T2 · axial · 5.0mm · 0.45mm/px · z∈[-37,+125]mm · 4 of 26 slices shown (1 of 3)]
[im 1/26]
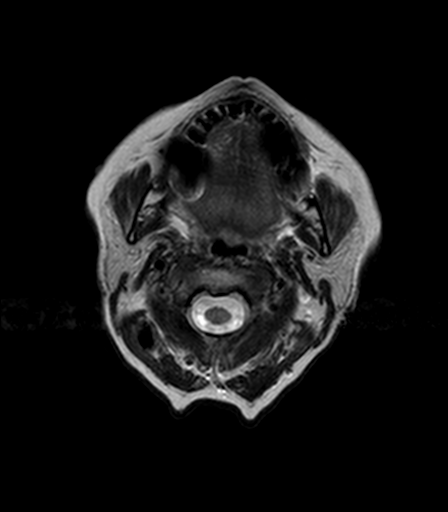
[im 9/26]
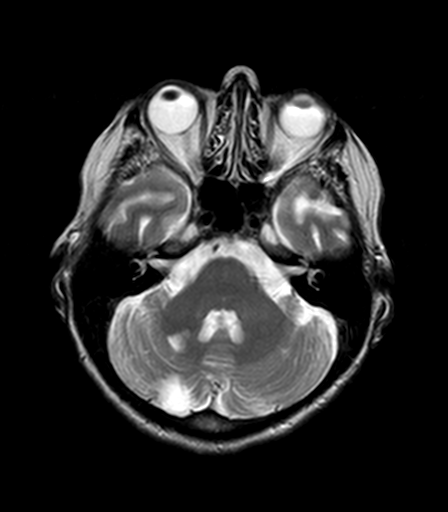
[im 17/26]
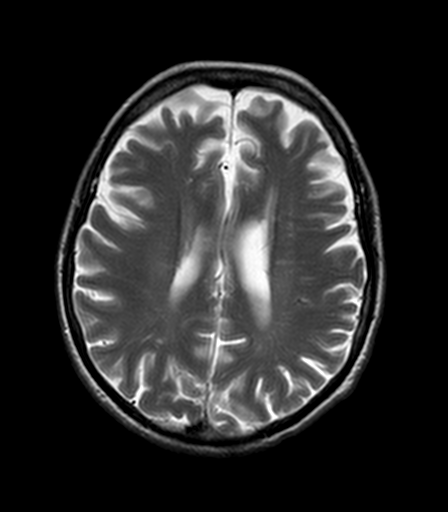
[im 26/26]
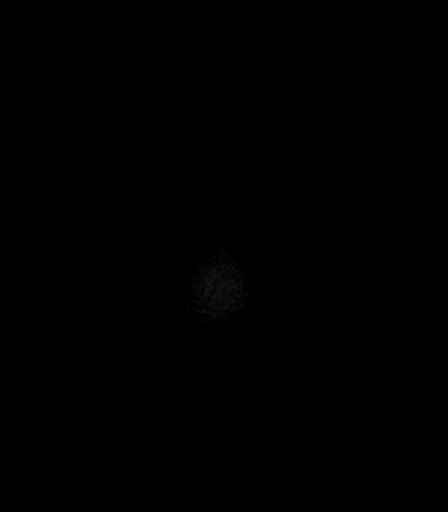

[Series 10: FLAIR · axial · 5.0mm · 0.90mm/px · z∈[-37,+124]mm · 4 of 26 slices shown]
[im 1/26]
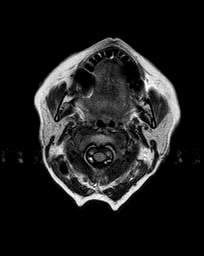
[im 9/26]
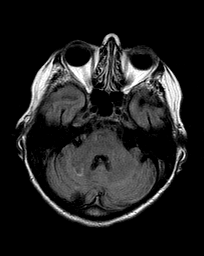
[im 17/26]
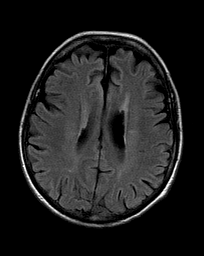
[im 26/26]
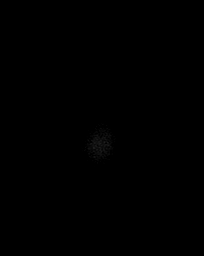

[Series 11: T2 · axial · 5.0mm · 0.45mm/px · z∈[-37,+125]mm · 4 of 26 slices shown (2 of 3)]
[im 1/26]
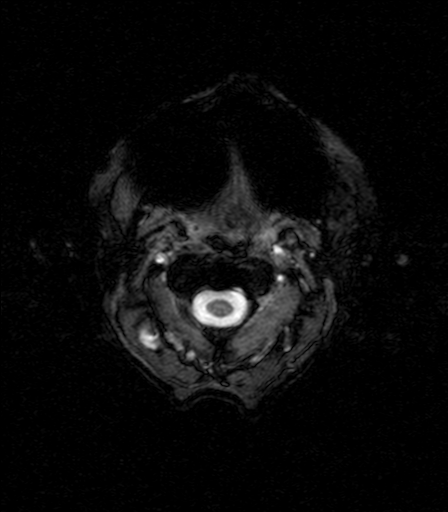
[im 9/26]
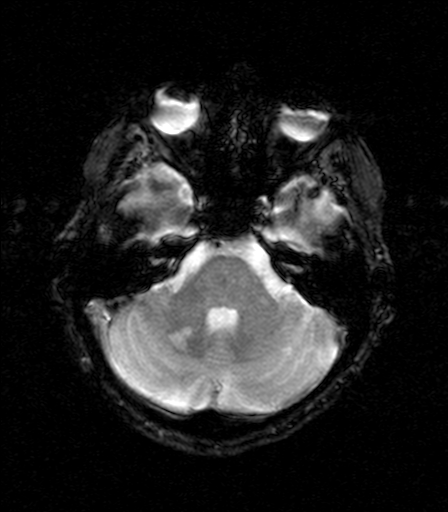
[im 17/26]
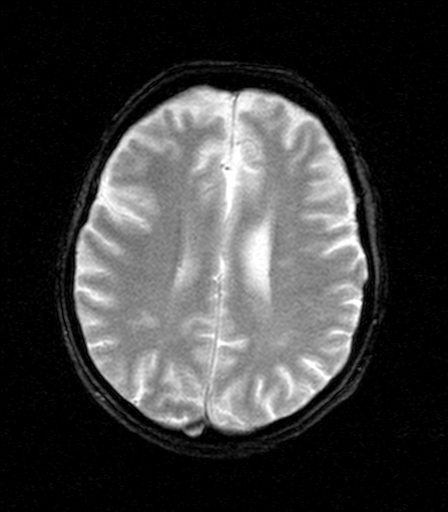
[im 26/26]
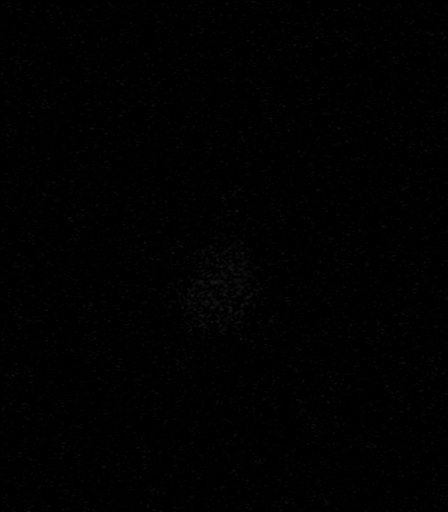

[Series 13: T2 · coronal · 5.0mm · 0.45mm/px · 4 of 29 slices shown (3 of 3)]
[im 1/29]
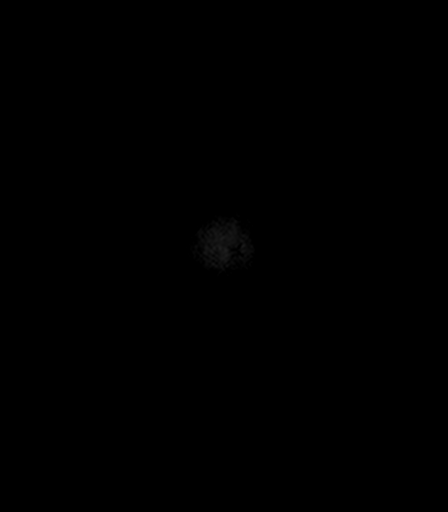
[im 10/29]
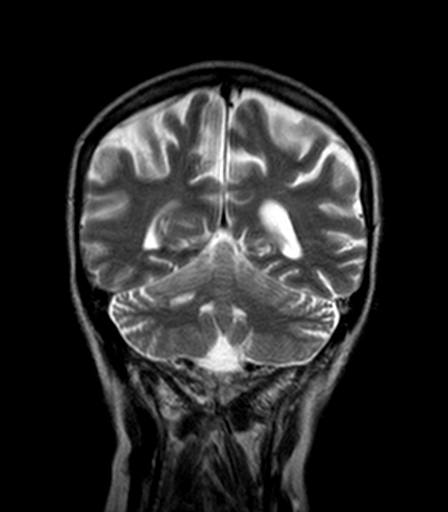
[im 19/29]
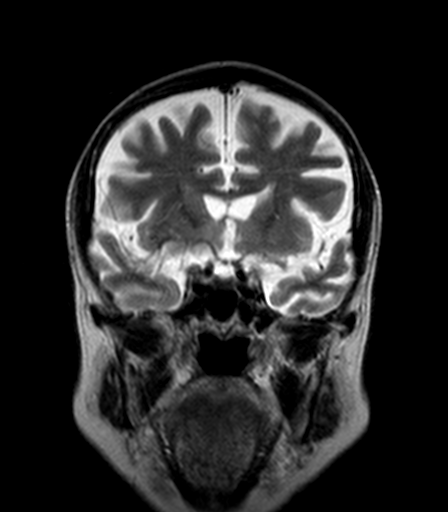
[im 29/29]
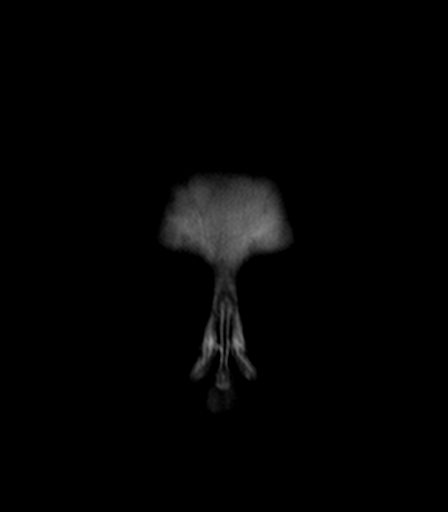

[40 of 48 positions shown; findings below may reference images not displayed]

FINDINGS: Moderate size remote posterior right cerebellar infarct. Small mid
cerebellar infarcts larger on the right. Remote posterior left
frontal lobe infarct with encephalomalacia.

No acute infarct or intracranial hemorrhage.

Moderate global atrophy without hydrocephalus.

No intracranial mass lesion noted on this unenhanced exam.

Small right vertebral artery. Ectatic left vertebral artery. Ectatic
narrowed basilar artery. Ectatic patent internal carotid arteries.
Degree of tortuosity limits evaluating for aneurysm at the
cavernous/supraclinoid level.

Cervical medullary junction, pituitary region, pineal region and
orbital structures unremarkable.
IMPRESSION: No acute infarct or intracranial hemorrhage.

Moderate size remote posterior right cerebellar infarct. Small mid
cerebellar infarcts larger on the right. Remote posterior left
frontal lobe infarct with encephalomalacia.

Moderate global atrophy without hydrocephalus.

Small right vertebral artery. Ectatic left vertebral artery. Ectatic
narrowed basilar artery. Ectatic patent internal carotid arteries.
Degree of tortuosity limits evaluating for aneurysm at the
cavernous/supraclinoid level.

## 2019-06-28 ENCOUNTER — Inpatient Hospital Stay: Payer: Medicare Other

## 2019-06-28 ENCOUNTER — Encounter: Payer: Self-pay | Admitting: Radiology

## 2019-06-28 ENCOUNTER — Emergency Department: Payer: Medicare Other

## 2019-06-28 ENCOUNTER — Other Ambulatory Visit: Payer: Self-pay

## 2019-06-28 ENCOUNTER — Inpatient Hospital Stay
Admission: EM | Admit: 2019-06-28 | Discharge: 2019-06-30 | DRG: 690 | Disposition: A | Discharge status: Home Health | Payer: Medicare Other | Attending: Internal Medicine | Admitting: Internal Medicine

## 2019-06-28 DIAGNOSIS — I10 Essential (primary) hypertension: Secondary | ICD-10-CM | POA: Diagnosis present

## 2019-06-28 DIAGNOSIS — F0151 Vascular dementia with behavioral disturbance: Secondary | ICD-10-CM | POA: Diagnosis present

## 2019-06-28 DIAGNOSIS — D649 Anemia, unspecified: Secondary | ICD-10-CM | POA: Diagnosis present

## 2019-06-28 DIAGNOSIS — Z803 Family history of malignant neoplasm of breast: Secondary | ICD-10-CM | POA: Diagnosis not present

## 2019-06-28 DIAGNOSIS — R296 Repeated falls: Secondary | ICD-10-CM | POA: Diagnosis present

## 2019-06-28 DIAGNOSIS — F419 Anxiety disorder, unspecified: Secondary | ICD-10-CM | POA: Diagnosis present

## 2019-06-28 DIAGNOSIS — J939 Pneumothorax, unspecified: Secondary | ICD-10-CM

## 2019-06-28 DIAGNOSIS — F0281 Dementia in other diseases classified elsewhere with behavioral disturbance: Secondary | ICD-10-CM | POA: Diagnosis present

## 2019-06-28 DIAGNOSIS — Z7952 Long term (current) use of systemic steroids: Secondary | ICD-10-CM

## 2019-06-28 DIAGNOSIS — E876 Hypokalemia: Secondary | ICD-10-CM | POA: Diagnosis present

## 2019-06-28 DIAGNOSIS — M069 Rheumatoid arthritis, unspecified: Secondary | ICD-10-CM | POA: Diagnosis present

## 2019-06-28 DIAGNOSIS — F329 Major depressive disorder, single episode, unspecified: Secondary | ICD-10-CM | POA: Diagnosis present

## 2019-06-28 DIAGNOSIS — B9689 Other specified bacterial agents as the cause of diseases classified elsewhere: Secondary | ICD-10-CM | POA: Diagnosis present

## 2019-06-28 DIAGNOSIS — W19XXXA Unspecified fall, initial encounter: Secondary | ICD-10-CM | POA: Diagnosis present

## 2019-06-28 DIAGNOSIS — Z79899 Other long term (current) drug therapy: Secondary | ICD-10-CM

## 2019-06-28 DIAGNOSIS — R4182 Altered mental status, unspecified: Secondary | ICD-10-CM | POA: Diagnosis present

## 2019-06-28 DIAGNOSIS — F0390 Unspecified dementia without behavioral disturbance: Secondary | ICD-10-CM

## 2019-06-28 DIAGNOSIS — Z23 Encounter for immunization: Secondary | ICD-10-CM

## 2019-06-28 DIAGNOSIS — E785 Hyperlipidemia, unspecified: Secondary | ICD-10-CM | POA: Diagnosis present

## 2019-06-28 DIAGNOSIS — F03C0 Unspecified dementia, severe, without behavioral disturbance, psychotic disturbance, mood disturbance, and anxiety: Secondary | ICD-10-CM

## 2019-06-28 DIAGNOSIS — G9349 Other encephalopathy: Secondary | ICD-10-CM | POA: Diagnosis present

## 2019-06-28 DIAGNOSIS — R17 Unspecified jaundice: Secondary | ICD-10-CM | POA: Diagnosis present

## 2019-06-28 DIAGNOSIS — Z1159 Encounter for screening for other viral diseases: Secondary | ICD-10-CM

## 2019-06-28 DIAGNOSIS — E860 Dehydration: Secondary | ICD-10-CM | POA: Diagnosis present

## 2019-06-28 DIAGNOSIS — Z7982 Long term (current) use of aspirin: Secondary | ICD-10-CM

## 2019-06-28 DIAGNOSIS — L89319 Pressure ulcer of right buttock, unspecified stage: Secondary | ICD-10-CM | POA: Diagnosis present

## 2019-06-28 DIAGNOSIS — G309 Alzheimer's disease, unspecified: Secondary | ICD-10-CM | POA: Diagnosis present

## 2019-06-28 DIAGNOSIS — I69319 Unspecified symptoms and signs involving cognitive functions following cerebral infarction: Secondary | ICD-10-CM | POA: Diagnosis not present

## 2019-06-28 DIAGNOSIS — L8990 Pressure ulcer of unspecified site, unspecified stage: Secondary | ICD-10-CM | POA: Insufficient documentation

## 2019-06-28 DIAGNOSIS — N3090 Cystitis, unspecified without hematuria: Principal | ICD-10-CM | POA: Diagnosis present

## 2019-06-28 DIAGNOSIS — K219 Gastro-esophageal reflux disease without esophagitis: Secondary | ICD-10-CM | POA: Diagnosis present

## 2019-06-28 HISTORY — DX: Unspecified symptoms and signs involving cognitive functions following cerebral infarction: I69.319

## 2019-06-28 HISTORY — DX: Depression, unspecified: F32.A

## 2019-06-28 HISTORY — DX: Rheumatoid arthritis, unspecified: M06.9

## 2019-06-28 HISTORY — DX: Hyperlipidemia, unspecified: E78.5

## 2019-06-28 HISTORY — DX: Essential (primary) hypertension: I10

## 2019-06-28 HISTORY — DX: Vascular dementia, unspecified severity, without behavioral disturbance, psychotic disturbance, mood disturbance, and anxiety: F01.50

## 2019-06-28 HISTORY — DX: Anxiety disorder, unspecified: F41.9

## 2019-06-28 LAB — COMPREHENSIVE METABOLIC PANEL
ALT: 21 U/L (ref 0–44)
AST: 50 U/L — ABNORMAL HIGH (ref 15–41)
Albumin: 3.7 g/dL (ref 3.5–5.0)
Alkaline Phosphatase: 80 U/L (ref 38–126)
Anion gap: 14 (ref 5–15)
BUN: 20 mg/dL (ref 8–23)
CO2: 20 mmol/L — ABNORMAL LOW (ref 22–32)
Calcium: 8.9 mg/dL (ref 8.9–10.3)
Chloride: 107 mmol/L (ref 98–111)
Creatinine, Ser: 0.62 mg/dL (ref 0.44–1.00)
GFR calc Af Amer: >60 mL/min (ref >60)
GFR calc non Af Amer: >60 mL/min (ref >60)
Glucose, Bld: 111 mg/dL — ABNORMAL HIGH (ref 70–99)
Potassium: 3.4 mmol/L — ABNORMAL LOW (ref 3.5–5.1)
Sodium: 141 mmol/L (ref 135–145)
Total Bilirubin: 1.5 mg/dL — ABNORMAL HIGH (ref 0.3–1.2)
Total Protein: 6.9 g/dL (ref 6.5–8.1)

## 2019-06-28 LAB — URINALYSIS, COMPLETE (UACMP) WITH MICROSCOPIC
Bilirubin Urine: NEGATIVE
Glucose, UA: NEGATIVE mg/dL
Ketones, ur: 5 mg/dL — AB
Nitrite: NEGATIVE
Protein, ur: 30 mg/dL — AB
Specific Gravity, Urine: 1.017 (ref 1.005–1.030)
WBC, UA: >50 WBC/hpf — ABNORMAL HIGH (ref 0–5)
pH: 6 (ref 5.0–8.0)

## 2019-06-28 LAB — CBC WITH DIFFERENTIAL/PLATELET
Abs Immature Granulocytes: 0.06 10*3/uL (ref 0.00–0.07)
Basophils Absolute: 0 10*3/uL (ref 0.0–0.1)
Basophils Relative: 0 %
Eosinophils Absolute: 0 10*3/uL (ref 0.0–0.5)
Eosinophils Relative: 0 %
HCT: 40 % (ref 36.0–46.0)
Hemoglobin: 13.2 g/dL (ref 12.0–15.0)
Immature Granulocytes: 1 %
Lymphocytes Relative: 6 %
Lymphs Abs: 0.8 10*3/uL (ref 0.7–4.0)
MCH: 30 pg (ref 26.0–34.0)
MCHC: 33 g/dL (ref 30.0–36.0)
MCV: 90.9 fL (ref 80.0–100.0)
Monocytes Absolute: 0.9 10*3/uL (ref 0.1–1.0)
Monocytes Relative: 8 %
Neutro Abs: 10.1 10*3/uL — ABNORMAL HIGH (ref 1.7–7.7)
Neutrophils Relative %: 85 %
Platelets: 289 10*3/uL (ref 150–400)
RBC: 4.4 MIL/uL (ref 3.87–5.11)
RDW: 15.6 % — ABNORMAL HIGH (ref 11.5–15.5)
WBC: 11.9 10*3/uL — ABNORMAL HIGH (ref 4.0–10.5)
nRBC: 0 % (ref 0.0–0.2)

## 2019-06-28 MED ORDER — FOLIC ACID 1 MG PO TABS
2.0000 mg | ORAL_TABLET | Freq: Every day | ORAL | Status: DC
  Administered 2019-06-29 – 2019-06-30 (×2): 2 mg via ORAL
  Filled 2019-06-28 (×2): qty 2

## 2019-06-28 MED ORDER — PRAVASTATIN SODIUM 20 MG PO TABS
20.0000 mg | ORAL_TABLET | Freq: Every day | ORAL | Status: DC
  Administered 2019-06-28: 20 mg via ORAL
  Filled 2019-06-28: qty 1

## 2019-06-28 MED ORDER — ENOXAPARIN SODIUM 40 MG/0.4ML ~~LOC~~ SOLN
40.0000 mg | SUBCUTANEOUS | Status: DC
  Administered 2019-06-28 – 2019-06-30 (×2): 40 mg via SUBCUTANEOUS
  Filled 2019-06-28 (×2): qty 0.4

## 2019-06-28 MED ORDER — ASPIRIN EC 81 MG PO TBEC
81.0000 mg | DELAYED_RELEASE_TABLET | Freq: Every day | ORAL | Status: DC
  Administered 2019-06-29 – 2019-06-30 (×2): 81 mg via ORAL
  Filled 2019-06-28 (×2): qty 1

## 2019-06-28 MED ORDER — METHOTREXATE 2.5 MG PO TABS
10.0000 mg | ORAL_TABLET | ORAL | Status: DC
  Filled 2019-06-28: qty 4

## 2019-06-28 MED ORDER — SODIUM CHLORIDE 0.9 % IV BOLUS
1000.0000 mL | Freq: Once | INTRAVENOUS | Status: AC
  Administered 2019-06-28: 1000 mL via INTRAVENOUS

## 2019-06-28 MED ORDER — PNEUMOCOCCAL VAC POLYVALENT 25 MCG/0.5ML IJ INJ
0.5000 mL | INJECTION | INTRAMUSCULAR | Status: AC
  Administered 2019-06-29: 0.5 mL via INTRAMUSCULAR
  Filled 2019-06-28: qty 0.5

## 2019-06-28 MED ORDER — SERTRALINE HCL 50 MG PO TABS
50.0000 mg | ORAL_TABLET | Freq: Every day | ORAL | Status: DC
  Administered 2019-06-29 – 2019-06-30 (×2): 50 mg via ORAL
  Filled 2019-06-28 (×2): qty 1

## 2019-06-28 MED ORDER — SODIUM CHLORIDE 0.9 % IV SOLN
1.0000 g | INTRAVENOUS | Status: DC
  Administered 2019-06-29 – 2019-06-30 (×2): 1 g via INTRAVENOUS
  Filled 2019-06-28 (×2): qty 1

## 2019-06-28 MED ORDER — DONEPEZIL HCL 5 MG PO TABS
10.0000 mg | ORAL_TABLET | Freq: Every day | ORAL | Status: DC
  Administered 2019-06-28: 10 mg via ORAL
  Filled 2019-06-28: qty 2

## 2019-06-28 MED ORDER — PANTOPRAZOLE SODIUM 40 MG PO TBEC
40.0000 mg | DELAYED_RELEASE_TABLET | Freq: Every day | ORAL | Status: DC
  Administered 2019-06-29 – 2019-06-30 (×2): 40 mg via ORAL
  Filled 2019-06-28 (×2): qty 1

## 2019-06-28 MED ORDER — POTASSIUM CHLORIDE 20 MEQ PO PACK: 20.0000 meq | PACK | Freq: Two times a day (BID) | ORAL | Status: DC

## 2019-06-28 MED ORDER — SODIUM CHLORIDE 0.9 % IV SOLN
1.0000 g | Freq: Once | INTRAVENOUS | Status: AC
  Administered 2019-06-28: 11:00:00 1 g via INTRAVENOUS
  Filled 2019-06-28: qty 10

## 2019-06-28 MED ORDER — IOHEXOL 350 MG/ML SOLN
75.0000 mL | Freq: Once | INTRAVENOUS | Status: AC | PRN
  Administered 2019-06-28: 75 mL via INTRAVENOUS

## 2019-06-28 NOTE — ED Provider Notes (Signed)
[facility] Emergency Department Provider Note  ____________________________________________  Time seen: Approximately 9:56 AM  I have reviewed the triage vital signs and the nursing notes.   HISTORY  Chief Complaint Fall    Level 5 Caveat: Portions of the History and Physical including HPI and review of systems are unable to be completely obtained due to patient being a poor historian   HPI Cheryl Arnold is a 73 y.o. female with a history of vascular dementia, rheumatoid arthritis, cerebellar stroke who was brought to the ED due to multiple falls.  She has had 3 falls over the last 3 days.  Had a fall this morning.  Patient unable to remember why she fell.  She denies any pain complaints or other acute issues.  She is also unable to remember her own name.      Past medical history includes GERD, anemia, rheumatoid arthritis, stroke, dementia   There are no active problems to display for this patient.    Past surgical history noncontributory   Prior to Admission medications   Not on File   ibuprofen (ADVIL,MOTRIN) 800 MG tablet  Take 1 tablet (800 mg total) by mouth every 8 (eight) hours as needed for Pain. 270 tablet  2 12/02/2015  Active  aspirin 81 MG EC tablet  Take 81 mg by mouth once daily.  0   Active  pravastatin (PRAVACHOL) 20 MG tablet  TAKE 1 TABLET NIGHTLY 90 tablet  4 26/83/4196  Active  folic acid (FOLVITE) 1 MG tablet  TAKE 2 TABLETS ONCE DAILY 180 tablet  1 05/22/2018  Active  donepezil (ARICEPT) 10 MG tablet  TAKE 1 TABLET NIGHTLY 180 tablet  4 09/09/2018  Active  omeprazole (PRILOSEC) 20 MG DR capsule  TAKE 1 CAPSULE DAILY 90 capsule  4 09/09/2018  Active  methotrexate (RHEUMATREX) 2.5 MG tablet  TAKE 4 TABLETS EVERY 7 DAYS 48 tablet  1 01/30/2019  Active  predniSONE (DELTASONE) 1 MG tablet  Take 2 tablets (2 mg total) by mouth once daily 60 tablet  1 06/12/2019  Active      Allergies Methotrexate  and Sulfasalazine   Family History  Problem Relation Age of Onset  . Breast cancer Maternal Aunt     Social History Social History   Tobacco Use  . Smoking status: Not on file  Substance Use Topics  . Alcohol use: Not on file  . Drug use: Not on file  No significant tobacco or alcohol use  Review of Systems Level 5 Caveat: Portions of the History and Physical including HPI and review of systems are unable to be completely obtained due to patient being a poor historian   Constitutional:   No known fever.  ENT:   No rhinorrhea. Cardiovascular:   No chest pain or syncope. Respiratory:   No dyspnea or cough. Gastrointestinal:   Negative for abdominal pain, vomiting and diarrhea.  Musculoskeletal:   Negative for focal pain or swelling ____________________________________________   PHYSICAL EXAM:  VITAL SIGNS: ED Triage Vitals  Enc Vitals Group     BP 06/28/19 0839 (!) 144/102     Pulse Rate 06/28/19 0839 75     Resp 06/28/19 0839 16     Temp 06/28/19 0839 97.8 F (36.6 C)     Temp Source 06/28/19 0839 Oral     SpO2 06/28/19 0839 98 %     Weight 06/28/19 0845 120 lb (54.4 kg)     Height 06/28/19 0845 5\' 3"  (1.6 m)  Head Circumference --      Peak Flow --      Pain Score --      Pain Loc --      Pain Edu? --      Excl. in GC? --     Vital signs reviewed, nursing assessments reviewed.   Constitutional:   Awake and alert.  Oriented x0. Non-toxic appearance. Eyes:   Conjunctivae are normal. EOMI. PERRL. ENT      Head:   Normocephalic and atraumatic.      Nose:   No congestion/rhinnorhea.       Mouth/Throat:   Dry mucous membranes, no pharyngeal erythema. No peritonsillar mass.       Neck:   No meningismus. Full ROM. Hematological/Lymphatic/Immunilogical:   No cervical lymphadenopathy. Cardiovascular:   RRR. Symmetric bilateral radial and DP pulses.  No murmurs. Cap refill less than 2 seconds. Respiratory:   Normal respiratory effort without  tachypnea/retractions. Breath sounds are clear and equal bilaterally. No wheezes/rales/rhonchi. Gastrointestinal:   Soft with suprapubic tenderness. Non distended. There is no CVA tenderness.  No rebound, rigidity, or guarding. Musculoskeletal:   Normal range of motion in all extremities. No joint effusions.  Mild tenderness at the right greater trochanter and right lateral malleolus without deformity or swelling or limited range of motion.  No edema. Neurologic:   Normal speech, very limited language expression.  Motor grossly intact. No acute focal neurologic deficits are appreciated.  Skin:    Skin is warm, dry and intact. No rash noted.  No petechiae, purpura, or bullae.  ____________________________________________    LABS (pertinent positives/negatives) (all labs ordered are listed, but only abnormal results are displayed) Labs Reviewed  COMPREHENSIVE METABOLIC PANEL - Abnormal; Notable for the following components:      Result Value   Potassium 3.4 (*)    CO2 20 (*)    Glucose, Bld 111 (*)    AST 50 (*)    Total Bilirubin 1.5 (*)    All other components within normal limits  CBC WITH DIFFERENTIAL/PLATELET - Abnormal; Notable for the following components:   WBC 11.9 (*)    RDW 15.6 (*)    Neutro Abs 10.1 (*)    All other components within normal limits  URINALYSIS, COMPLETE (UACMP) WITH MICROSCOPIC - Abnormal; Notable for the following components:   Color, Urine YELLOW (*)    APPearance HAZY (*)    Hgb urine dipstick MODERATE (*)    Ketones, ur 5 (*)    Protein, ur 30 (*)    Leukocytes,Ua SMALL (*)    WBC, UA >50 (*)    Bacteria, UA MANY (*)    All other components within normal limits  URINE CULTURE  URINE CULTURE   ____________________________________________   EKG  Interpreted by me Sinus rhythm rate of 67, normal axis and intervals.  Normal QRS ST segments and T waves  ____________________________________________    RADIOLOGY  Dg Ankle Complete  Right  Result Date: 06/28/2019 CLINICAL DATA:  Right ankle pain after multiple falls. EXAM: RIGHT ANKLE - COMPLETE 3+ VIEW COMPARISON:  None. FINDINGS: There is no evidence of fracture, dislocation, or joint effusion. There is no evidence of arthropathy or other focal bone abnormality. Soft tissues are unremarkable. IMPRESSION: Negative. Electronically Signed   By: Lupita Raider M.D.   On: 06/28/2019 09:39   Dg Hip Unilat W Or Wo Pelvis 2-3 Views Right  Result Date: 06/28/2019 CLINICAL DATA:  Right hip pain after multiple falls. EXAM: DG HIP (  WITH OR WITHOUT PELVIS) 2-3V RIGHT COMPARISON:  None. FINDINGS: There is no evidence of hip fracture or dislocation. There is no evidence of arthropathy or other focal bone abnormality. IMPRESSION: Negative. Electronically Signed   By: Lupita Raider M.D.   On: 06/28/2019 09:37    ____________________________________________   PROCEDURES Procedures  ____________________________________________  DIFFERENTIAL DIAGNOSIS   Right hip fracture, ankle fracture, UTI, dehydration, metabolic derangement.  No evidence of head trauma, doubt intracranial hemorrhage or C-spine fracture  CLINICAL IMPRESSION / ASSESSMENT AND PLAN / ED COURSE  Pertinent labs & imaging results that were available during my care of the patient were reviewed by me and considered in my medical decision making (see chart for details).   Cheryl Arnold was evaluated in Emergency Department on 06/28/2019 for the symptoms described in the history of present illness. She was evaluated in the context of the global COVID-19 pandemic, which necessitated consideration that the patient might be at risk for infection with the SARS-CoV-2 virus that causes COVID-19. Institutional protocols and algorithms that pertain to the evaluation of patients at risk for COVID-19 are in a state of rapid change based on information released by regulatory bodies including the CDC and federal and state organizations.  These policies and algorithms were followed during the patient's care in the ED.   Patient presents with multiple falls, weakness.  Clinically appears mildly dehydrated although vital signs are unremarkable.  Will give IV fluids while checking labs and urinalysis.  Clinical Course as of Jun 27 1328  Sat Jun 28, 2019  5003 Urinalysis diagnostic of UTI.  X-rays negative for any acute injury of the right hip or ankle or pelvis.  IV ceftriaxone ordered for initial antibiotic therapy.  Urine culture sent.   [PS]  1240 .  Discussed with the patient's daughter Sheran Newstrom who notes that the patient lives with spouse who has mobility issues and is unable to provide the physical support the patient needs currently with her decreased level of function, high risk of falls.  With her acute decompensation and difficulty with ADLs in the setting of acute illness, I will discuss with the hospitalist for further management.  Daughter is in agreement with hospitalization at this time.   [PS]    Clinical Course User Index [PS] Sharman Cheek, MD     ____________________________________________   FINAL CLINICAL IMPRESSION(S) / ED DIAGNOSES    Final diagnoses:  Fall, initial encounter  Cystitis  Advanced dementia [facility])     ED Discharge Orders    None      Portions of this note were generated with dragon dictation software. Dictation errors may occur despite best attempts at proofreading.   Sharman Cheek, MD 06/28/19 1329

## 2019-06-28 NOTE — ED Notes (Signed)
Transported to radiology 

## 2019-06-28 NOTE — Progress Notes (Signed)
Patient admitted earlier due to altered mental status/encephalopathy secondary to UTI.  Underwent a CTA head and neck which incidentally showed a right apical pneumothorax.  Spoke to the radiologist who notified me of this.  Patient is not hypoxic.  -I will have ordered a two-view chest x-ray to follow-up.

## 2019-06-28 NOTE — ED Notes (Signed)
ED TO INPATIENT HANDOFF REPORT  ED Nurse Name and Phone #:  Quillian Quince [phone]  S Name/Age/Gender Cheryl Arnold 73 y.o. female Room/Bed: ED26A/ED26A  Code Status   Code Status: Full Code  Home/SNF/Other Home Patient oriented to: self Is this baseline? No   Triage Complete: Triage complete  Chief Complaint fall  Triage Note Pt arrives via ems from home pt was found on the floor by her husband, pt has fallen 3 times in the past 3 days and this time was unwitnessed. Pt has an aide who works with her reported that she has a strong smell of urine, pt has also had a decreased appetite and intake recently. Ems reported no injuries and no complaints via pt   Allergies No Active Allergies  Level of Care/Admitting Diagnosis ED Disposition    ED Disposition Condition [facility] Area: [place] [100120]  Level of Care: Med-Surg [16]  Covid Evaluation: Confirmed COVID Negative  Diagnosis: Altered mental status [780.97.ICD-9-CM]  Admitting Physician: Rufina Falco [place]  Attending Physician: Rufina Falco ACHIENG [[id number]]  Estimated length of stay: past midnight tomorrow  Certification:: I certify this patient will need inpatient services for at least 2 midnights  PT Class (Do Not Modify): Inpatient [101]  PT Acc Code (Do Not Modify): Private [1]       B Medical/Surgery History No past medical history on file. No past surgical history on file.   A IV Location/Drains/Wounds Patient Lines/Drains/Airways Status   Active Line/Drains/Airways    Name:   Placement date:   Placement time:   Site:   Days:   Peripheral IV 06/28/19 Right Forearm   06/28/19    1403    Forearm   less than 1          Intake/Output Last 24 hours  Intake/Output Summary (Last 24 hours) at 06/28/2019 1428 Last data filed at 06/28/2019 1130 Gross per 24 hour  Intake 100 ml  Output -  Net 100 ml    Labs/Imaging Results for orders placed or  performed during the hospital encounter of 06/28/19 (from the past 48 hour(s))  Comprehensive metabolic panel     Status: Abnormal   Collection Time: 06/28/19  8:46 AM  Result Value Ref Range   Sodium 141 135 - 145 mmol/L   Potassium 3.4 (L) 3.5 - 5.1 mmol/L   Chloride 107 98 - 111 mmol/L   CO2 20 (L) 22 - 32 mmol/L   Glucose, Bld 111 (H) 70 - 99 mg/dL   BUN 20 8 - 23 mg/dL   Creatinine, Ser 0.62 0.44 - 1.00 mg/dL   Calcium 8.9 8.9 - 10.3 mg/dL   Total Protein 6.9 6.5 - 8.1 g/dL   Albumin 3.7 3.5 - 5.0 g/dL   AST 50 (H) 15 - 41 U/L   ALT 21 0 - 44 U/L   Alkaline Phosphatase 80 38 - 126 U/L   Total Bilirubin 1.5 (H) 0.3 - 1.2 mg/dL   GFR calc non Af Amer >60 >60 mL/min   GFR calc Af Amer >60 >60 mL/min   Anion gap 14 5 - 15    Comment: Performed at [facility], [address] [place], [place] [address]  CBC with Differential     Status: Abnormal   Collection Time: 06/28/19  8:46 AM  Result Value Ref Range   WBC 11.9 (H) 4.0 - 10.5 K/uL   RBC 4.40 3.87 - 5.11 MIL/uL   Hemoglobin  13.2 12.0 - 15.0 g/dL   HCT 40.940.0 81.136.0 - 91.446.0 %   MCV 90.9 80.0 - 100.0 fL   MCH 30.0 26.0 - 34.0 pg   MCHC 33.0 30.0 - 36.0 g/dL   RDW 78.215.6 (H) 95.611.5 - 21.315.5 %   Platelets 289 150 - 400 K/uL   nRBC 0.0 0.0 - 0.2 %   Neutrophils Relative % 85 %   Neutro Abs 10.1 (H) 1.7 - 7.7 K/uL   Lymphocytes Relative 6 %   Lymphs Abs 0.8 0.7 - 4.0 K/uL   Monocytes Relative 8 %   Monocytes Absolute 0.9 0.1 - 1.0 K/uL   Eosinophils Relative 0 %   Eosinophils Absolute 0.0 0.0 - 0.5 K/uL   Basophils Relative 0 %   Basophils Absolute 0.0 0.0 - 0.1 K/uL   Immature Granulocytes 1 %   Abs Immature Granulocytes 0.06 0.00 - 0.07 K/uL    Comment: Performed at [facility], [address] [place], [place] [address]  Urinalysis, Complete w Microscopic     Status: Abnormal   Collection Time: 06/28/19  8:47 AM  Result Value Ref Range   Color, Urine YELLOW (A) YELLOW   APPearance HAZY (A) CLEAR    Specific Gravity, Urine 1.017 1.005 - 1.030   pH 6.0 5.0 - 8.0   Glucose, UA NEGATIVE NEGATIVE mg/dL   Hgb urine dipstick MODERATE (A) NEGATIVE   Bilirubin Urine NEGATIVE NEGATIVE   Ketones, ur 5 (A) NEGATIVE mg/dL   Protein, ur 30 (A) NEGATIVE mg/dL   Nitrite NEGATIVE NEGATIVE   Leukocytes,Ua SMALL (A) NEGATIVE   RBC / HPF 0-5 0 - 5 RBC/hpf   WBC, UA >50 (H) 0 - 5 WBC/hpf   Bacteria, UA MANY (A) NONE SEEN   Squamous Epithelial / LPF 0-5 0 - 5    Comment: Performed at [facility], [address] [place], [place] [address]   Dg Ankle Complete Right  Result Date: 06/28/2019 CLINICAL DATA:  Right ankle pain after multiple falls. EXAM: RIGHT ANKLE - COMPLETE 3+ VIEW COMPARISON:  None. FINDINGS: There is no evidence of fracture, dislocation, or joint effusion. There is no evidence of arthropathy or other focal bone abnormality. Soft tissues are unremarkable. IMPRESSION: Negative. Electronically Signed   By: Lupita RaiderJames  Green Jr M.D.   On: 06/28/2019 09:39   Dg Hip Unilat W Or Wo Pelvis 2-3 Views Right  Result Date: 06/28/2019 CLINICAL DATA:  Right hip pain after multiple falls. EXAM: DG HIP (WITH OR WITHOUT PELVIS) 2-3V RIGHT COMPARISON:  None. FINDINGS: There is no evidence of hip fracture or dislocation. There is no evidence of arthropathy or other focal bone abnormality. IMPRESSION: Negative. Electronically Signed   By: Lupita RaiderJames  Green Jr M.D.   On: 06/28/2019 09:37    Pending Labs Unresulted Labs (From admission, onward)    Start     Ordered   06/28/19 1341  HIV antibody (Routine Testing)  Once,   STAT     06/28/19 1351   06/28/19 1241  Novel Coronavirus,NAA,(SEND-OUT TO REF LAB - TAT 24-48 hrs); Hosp Order  (Asymptomatic Patients Labs)  ONCE - STAT,   STAT    Question:  Rule Out  Answer:  Yes   06/28/19 1240   06/28/19 0904  Urine culture  Add-on,   AD     06/28/19 [id number]   06/28/19 0842  Urine culture  Once,   STAT     06/28/19 [id number]  Vitals/Pain Today's Vitals    06/28/19 1230 06/28/19 1325 06/28/19 1330 06/28/19 1400  BP: 134/89 (!) 155/100 (!) 165/108 (!) 159/93  Pulse: 66 93 83 77  Resp: 18 (!) 22 (!) 26 (!) 21  Temp:  98.1 F (36.7 C)    TempSrc:  Oral    SpO2: 100% 95% 100% 100%  Weight:      Height:        Isolation Precautions No active isolations  Medications Medications  aspirin EC tablet 81 mg (has no administration in time range)  methotrexate (RHEUMATREX) tablet 10 mg (has no administration in time range)  pravastatin (PRAVACHOL) tablet 20 mg (has no administration in time range)  donepezil (ARICEPT) tablet 10 mg (has no administration in time range)  sertraline (ZOLOFT) tablet 50 mg (has no administration in time range)  pantoprazole (PROTONIX) EC tablet 40 mg (has no administration in time range)  folic acid (FOLVITE) tablet 2 mg (has no administration in time range)  enoxaparin (LOVENOX) injection 40 mg (has no administration in time range)  sodium chloride 0.9 % bolus 1,000 mL (0 mLs Intravenous Stopped 06/28/19 1153)  cefTRIAXone (ROCEPHIN) 1 g in sodium chloride 0.9 % 100 mL IVPB (0 g Intravenous Stopped 06/28/19 1130)    Mobility walks with device High fall risk   Focused Assessments Cardiac Assessment Handoff:  Cardiac Rhythm: Normal sinus rhythm No results found for: CKTOTAL, CKMB, CKMBINDEX, TROPONINI No results found for: DDIMER Does the Patient currently have chest pain? No   , Neuro Assessment Handoff:  Swallow screen pass? Yes  Cardiac Rhythm: Normal sinus rhythm       Neuro Assessment: Exceptions to WDL Neuro Checks:      Last Documented NIHSS Modified Score:   Has TPA been given? No If patient is a Neuro Trauma and patient is going to OR before floor call report to 4N Charge nurse: [phone] or [phone]     R Recommendations: See Admitting Provider Note  Report given to:   Additional Notes:

## 2019-06-28 NOTE — H&P (Signed)
[employer] [facility] [place] [facility]   PATIENT NAME: Cheryl Arnold    MR#:  [id number]  DATE OF BIRTH:  1946/04/05  DATE OF ADMISSION:  06/28/2019  PRIMARY CARE PHYSICIAN: Kandyce Rud, MD   REQUESTING/REFERRING PHYSICIAN: Alfonse Flavors, MD  CHIEF COMPLAINT:   Chief Complaint  Patient presents with  . Fall   HISTORY OF PRESENT ILLNESS:  73 y.o. 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  She is not a good historian therefore history mostly obtained from ED report.  Per ED report, patient arrived via EMS from home, apparently she was found on the floor by her husband who did not witness the fall.  Per patient's husband patient has had 3 falls in the past 3 days.  Unclear if patient lost consciousness with the fall with the fall so she is not a good historian and unable to state events prior to or following the fall.  Per ED reports aide upon really reported that she has strong smell of urine, and has had decreased p.o. intake.  On arrival to the ED, she was afebrile with blood pressure 144/102 mm Hg and pulse rate 67 beats/min. There were no focal neurological deficits; she was alert but only oriented to self.  Initial labs revealed potassium 3.4, AST 50, slightly elevated bilirubin 1.5, WBC 11.9, UA shows mild UTI.  ECG showed sinus rhythm of 67 beats per minute.  She is being admitted to hospitalist service for further management.  PAST MEDICAL HISTORY:  History reviewed. No pertinent past medical history.  PAST SURGICAL HISTORY:  No past surgical history on file.  SOCIAL HISTORY:   Social History   Tobacco Use  . Smoking status: Not on file  Substance Use Topics  . Alcohol use: Not on file    FAMILY HISTORY:   Family History  Problem Relation  Age of Onset  . Breast cancer Maternal Aunt     DRUG ALLERGIES:  No Active Allergies  REVIEW OF SYSTEMS:   ROS Unable to obtain from patient due to current altered mental status and underlying dementia.  MEDICATIONS AT HOME:   Prior to Admission medications   Medication Sig Start Date End Date Taking? Authorizing Provider  aspirin EC 81 MG tablet Take 81 mg by mouth daily.   Yes [provider]  donepezil (ARICEPT) 10 MG tablet Take 10 mg by mouth Nightly. 09/09/18  Yes [provider]  folic acid (FOLVITE) 1 MG tablet Take 2 mg by mouth daily. 05/22/18  Yes [provider]  methotrexate (RHEUMATREX) 2.5 MG tablet Take 4 tablets by mouth once a week. 01/30/19  Yes [provider]  omeprazole (PRILOSEC) 20 MG capsule Take 20 mg by mouth daily. 09/09/18  Yes [provider]  pravastatin (PRAVACHOL) 20 MG tablet Take 20 mg by mouth Nightly. 03/12/18  Yes [provider]  predniSONE (DELTASONE) 1 MG tablet Take 2 mg by mouth daily. 06/12/19  Yes [provider]  sertraline (ZOLOFT) 50 MG tablet Take 50 mg by mouth daily.   Yes [provider]      VITAL SIGNS:  Blood pressure (!) 159/93, pulse 77, temperature 98.1 F (36.7 C), temperature source Oral, resp. rate (!) 21, height 5\' 3"  (1.6 m), weight 54.4 kg, SpO2 100 %.  PHYSICAL EXAMINATION:   Physical Exam  GENERAL:  73 y.o.-year-old patient lying in the bed with no acute distress.  EYES: Pupils equal, round, reactive to light and accommodation. No scleral icterus. Extraocular muscles intact.  HEENT: Head atraumatic, normocephalic. Oropharynx and nasopharynx clear.  NECK:  Supple, no jugular venous distention. No thyroid enlargement, no tenderness.  LUNGS: Normal breath sounds bilaterally, no wheezing, rales,rhonchi or crepitation. No use of accessory muscles of respiration.  CARDIOVASCULAR: S1, S2 normal. No murmurs, rubs, or gallops.  ABDOMEN: Soft, nontender,  nondistended. Bowel sounds present. No organomegaly or mass.  EXTREMITIES: No pedal edema, cyanosis, or clubbing. No rash or lesions. + pedal pulses MUSCULOSKELETAL: Normal bulk, and power was 5+ grip and elbow, knee, and ankle flexion and extension bilaterally.  NEUROLOGIC:Alert and oriented x 3. CN 2-12 intact. Sensation to light touch and cold stimuli intact bilaterally. Finger to nose nl. Babinski is downgoing. DTR's (biceps, patellar, and achilles) 2+ and symmetric throughout. Gait not tested due to safety concern. PSYCHIATRIC: The patient is alert and oriented x 1. Confused asking to go home SKIN: No obvious rash, lesion, or ulcer.   DATA REVIEWED:  LABORATORY PANEL:   CBC Recent Labs  Lab 06/28/19 0846  WBC 11.9*  HGB 13.2  HCT 40.0  PLT 289   ------------------------------------------------------------------------------------------------------------------  Chemistries  Recent Labs  Lab 06/28/19 0846  NA 141  K 3.4*  CL 107  CO2 20*  GLUCOSE 111*  BUN 20  CREATININE 0.62  CALCIUM 8.9  AST 50*  ALT 21  ALKPHOS 80  BILITOT 1.5*   ------------------------------------------------------------------------------------------------------------------  Cardiac Enzymes No results for input(s): TROPONINI in the last 168 hours. ------------------------------------------------------------------------------------------------------------------  RADIOLOGY:  Dg Ankle Complete Right  Result Date: 06/28/2019 CLINICAL DATA:  Right ankle pain after multiple falls. EXAM: RIGHT ANKLE - COMPLETE 3+ VIEW COMPARISON:  None. FINDINGS: There is no evidence of fracture, dislocation, or joint effusion. There is no evidence of arthropathy or other focal bone abnormality. Soft tissues are unremarkable. IMPRESSION: Negative. Electronically Signed   By: Marijo Conception M.D.   On: 06/28/2019 09:39   Dg Hip Unilat W Or Wo Pelvis 2-3 Views Right  Result Date: 06/28/2019 CLINICAL DATA:  Right hip  pain after multiple falls. EXAM: DG HIP (WITH OR WITHOUT PELVIS) 2-3V RIGHT COMPARISON:  None. FINDINGS: There is no evidence of hip fracture or dislocation. There is no evidence of arthropathy or other focal bone abnormality. IMPRESSION: Negative. Electronically Signed   By: Marijo Conception M.D.   On: 06/28/2019 09:37    EKG:   EKG: unchanged from previous tracings, normal sinus rhythm. Vent. rate 67 BPM PR interval * ms QRS duration 97 ms QT/QTc 435/460 ms P-R-T axes 66 5 28 IMPRESSION AND PLAN:   72 y.o. 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  1. Altered mental status -likely due to UTI in a patient with history of underlying dementia -Admit to MedSurg unit -Check B12 TSH -Check CTA head and neck  2. 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 UTI - UA shows mild UTI - Urine culture pending - Continue IV antibiotics with ceftriaxone - IVFs  4. History of CVA - ASA 81mg  PO  daily  5. HLD + Goal LDL<100 - Pravastatin 20mg  PO qhs   6. Vascular dementia with behavioral disturbances -* Continue Aricept  7. DVT prophylaxis - Enoxaparin SubQ    All the records are reviewed and case discussed with ED provider. Management plans discussed with the patient, family and they are in agreement.  CODE STATUS: FULL  TOTAL TIME TAKING CARE OF THIS PATIENT: 40 minutes.    on 06/28/2019 at 2:50 PM  Webb SilversmithElizabeth Braylen Denunzio, DNP, FNP-BC [facility] Nurse Practitioner Between 7am to 6pm - Pager [phone]  After 6pm go to [email or web address] - Social research officer, [email or web address] [email or web address]  [facility]  Office  [phone]  CC: Primary care physician; Kandyce RudBabaoff, Marcus, MD

## 2019-06-28 NOTE — ED Notes (Addendum)
Pt attempting to get out of bed, pt assisted to bathroom with 2 RNs, pt assisted back to the bed, bed alarm placed on bed, Pt also removed IV. Pt has high fall risk band on as well as yellow socks.

## 2019-06-28 NOTE — ED Triage Notes (Signed)
Pt arrives via ems from home pt was found on the floor by her husband, pt has fallen 3 times in the past 3 days and this time was unwitnessed. Pt has an aide who works with her reported that she has a strong smell of urine, pt has also had a decreased appetite and intake recently. Ems reported no injuries and no complaints via pt

## 2019-06-29 ENCOUNTER — Other Ambulatory Visit: Payer: Self-pay

## 2019-06-29 DIAGNOSIS — L8990 Pressure ulcer of unspecified site, unspecified stage: Secondary | ICD-10-CM | POA: Insufficient documentation

## 2019-06-29 LAB — BASIC METABOLIC PANEL
Anion gap: 10 (ref 5–15)
BUN: 17 mg/dL (ref 8–23)
CO2: 18 mmol/L — ABNORMAL LOW (ref 22–32)
Calcium: 8.4 mg/dL — ABNORMAL LOW (ref 8.9–10.3)
Chloride: 115 mmol/L — ABNORMAL HIGH (ref 98–111)
Creatinine, Ser: 0.56 mg/dL (ref 0.44–1.00)
GFR calc Af Amer: >60 mL/min (ref >60)
GFR calc non Af Amer: >60 mL/min (ref >60)
Glucose, Bld: 88 mg/dL (ref 70–99)
Potassium: 2.8 mmol/L — ABNORMAL LOW (ref 3.5–5.1)
Sodium: 143 mmol/L (ref 135–145)

## 2019-06-29 LAB — CBC WITH DIFFERENTIAL/PLATELET
Abs Immature Granulocytes: 0.06 10*3/uL (ref 0.00–0.07)
Basophils Absolute: 0 10*3/uL (ref 0.0–0.1)
Basophils Relative: 0 %
Eosinophils Absolute: 0 10*3/uL (ref 0.0–0.5)
Eosinophils Relative: 0 %
HCT: 34.8 % — ABNORMAL LOW (ref 36.0–46.0)
Hemoglobin: 11.3 g/dL — ABNORMAL LOW (ref 12.0–15.0)
Immature Granulocytes: 1 %
Lymphocytes Relative: 13 %
Lymphs Abs: 1 10*3/uL (ref 0.7–4.0)
MCH: 29.5 pg (ref 26.0–34.0)
MCHC: 32.5 g/dL (ref 30.0–36.0)
MCV: 90.9 fL (ref 80.0–100.0)
Monocytes Absolute: 0.6 10*3/uL (ref 0.1–1.0)
Monocytes Relative: 8 %
Neutro Abs: 5.9 10*3/uL (ref 1.7–7.7)
Neutrophils Relative %: 78 %
Platelets: 262 10*3/uL (ref 150–400)
RBC: 3.83 MIL/uL — ABNORMAL LOW (ref 3.87–5.11)
RDW: 15.8 % — ABNORMAL HIGH (ref 11.5–15.5)
WBC: 7.6 10*3/uL (ref 4.0–10.5)
nRBC: 0 % (ref 0.0–0.2)

## 2019-06-29 LAB — TSH: TSH: 2.572 u[IU]/mL (ref 0.350–4.500)

## 2019-06-29 LAB — VITAMIN B12: Vitamin B-12: 1737 pg/mL — ABNORMAL HIGH (ref 180–914)

## 2019-06-29 LAB — MAGNESIUM: Magnesium: 2 mg/dL (ref 1.7–2.4)

## 2019-06-29 MED ORDER — SODIUM CHLORIDE 0.9 % IV SOLN
INTRAVENOUS | Status: DC | PRN
  Administered 2019-06-29: 12:00:00 250 mL via INTRAVENOUS

## 2019-06-29 MED ORDER — HYDRALAZINE HCL 20 MG/ML IJ SOLN: 10.0000 mg | Freq: Four times a day (QID) | INTRAMUSCULAR | Status: DC | PRN

## 2019-06-29 MED ORDER — POTASSIUM CHLORIDE CRYS ER 20 MEQ PO TBCR
40.0000 meq | EXTENDED_RELEASE_TABLET | ORAL | Status: AC
  Administered 2019-06-29 (×2): 40 meq via ORAL
  Filled 2019-06-29 (×2): qty 2

## 2019-06-29 NOTE — Evaluation (Signed)
Physical Therapy Evaluation Patient Details Name: Cheryl Arnold MRN: [id number] DOB: 1946/02/14 Today's Date: 06/29/2019   History of Present Illness  73 y.o. 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  Clinical Impression  Pt did relatively well mobility and ambulation but her biggest issue is clearly cognition.  She was unable to state even her first name, much less date, situation, home setting, etc.  She needed cues to initiate mobility and standing, but once cued (usually needing actual physical "assist" she was able to perform relatively well apart from needing constant directional cuing and assist to insure safety.  Pt will clearly need 24/7 assist as she is quite confused but truly she did relatively well with purely PT related activities.      Follow Up Recommendations Home health PT;Supervision/Assistance - 24 hour    Equipment Recommendations  None recommended by PT    Recommendations for Other Services       Precautions / Restrictions Precautions Precautions: Fall Restrictions Weight Bearing Restrictions: No      Mobility  Bed Mobility Overal bed mobility: Needs Assistance Bed Mobility: Supine to Sit     Supine to sit: Min assist     General bed mobility comments: Pt was able to rise to sitting with light assist to initiate movement (could not follow instructions to initiate movement), did most of transition independently once started  Transfers Overall transfer level: Needs assistance Equipment used: Rolling walker (2 wheeled) Transfers: Sit to/from Stand Sit to Stand: Min guard         General transfer comment: again needed direct cuing to initiate movement, but able to rise and maintain balance once initiated  Ambulation/Gait Ambulation/Gait  assistance: Supervision Gait Distance (Feet): 40 Feet Assistive device: Rolling walker (2 wheeled);None       General Gait Details: Pt confused and struggled to follow instructions but was able to ambualte confidently with and w/o AD with no LOBs.  Did not leave room secondary to pending Covid-results, however apart from needing a lot of directional/situational cuing she was able to phyiscally manage well.  appartently she got up independently earlier and walked into the hallway unassisted  Stairs            Wheelchair Mobility    Modified Rankin (Stroke Patients Only)       Balance Overall balance assessment: Modified Independent                                           Pertinent Vitals/Pain Pain Assessment: (does not appear to have pain, unable to answer)    Home Living Family/patient expects to be discharged to:: Unsure Living Arrangements: Spouse/significant other               Additional Comments: Pt unable to answer any questions about living situation    Prior Function Level of Independence: (needs assistance secondary to mental status?)         Comments: appears she could be up walking ad lib, just very limited safety awareness     Hand Dominance        Extremity/Trunk Assessment   Upper Extremity Assessment Upper Extremity Assessment: Overall WFL for tasks assessed;Difficult to assess due to impaired cognition    Lower Extremity  Assessment Lower Extremity Assessment: Overall WFL for tasks assessed;Difficult to assess due to impaired cognition       Communication   Communication: Expressive difficulties(difficutly uttering even min words; name, pain question...)  Cognition Arousal/Alertness: Awake/alert Behavior During Therapy: Flat affect Overall Cognitive Status: Difficult to assess(h/o dementia, unsure if this is more severe than baseline)                                        General Comments       Exercises     Assessment/Plan    PT Assessment Patient needs continued PT services  PT Problem List Decreased activity tolerance;Decreased balance;Decreased cognition;Decreased safety awareness       PT Treatment Interventions Neuromuscular re-education;Cognitive remediation;Balance training;Therapeutic exercise;Therapeutic activities;Functional mobility training;Stair training;Gait training    PT Goals (Current goals can be found in the Care Plan section)  Acute Rehab PT Goals Patient Stated Goal: unable to state PT Goal Formulation: Patient unable to participate in goal setting Time For Goal Achievement: 07/13/19 Potential to Achieve Goals: Fair    Frequency Min 2X/week   Barriers to discharge        Co-evaluation               AM-PAC PT "6 Clicks" Mobility  Outcome Measure Help needed turning from your back to your side while in a flat bed without using bedrails?: None Help needed moving from lying on your back to sitting on the side of a flat bed without using bedrails?: None Help needed moving to and from a bed to a chair (including a wheelchair)?: None Help needed standing up from a chair using your arms (e.g., wheelchair or bedside chair)?: None Help needed to walk in hospital room?: A Little Help needed climbing 3-5 steps with a railing? : A Little 6 Click Score: 22    End of Session Equipment Utilized During Treatment: Gait belt Activity Tolerance: Patient tolerated treatment well Patient left: with chair alarm set;with call bell/phone within reach Nurse Communication: Mobility status PT Visit Diagnosis: Repeated falls (R29.6);History of falling (Z91.81)    Time: 4193-7902 PT Time Calculation (min) (ACUTE ONLY): 23 min   Charges:   PT Evaluation $PT Eval Low Complexity: 1 Low          Kreg Shropshire, DPT 06/29/2019, 10:48 AM

## 2019-06-29 NOTE — Plan of Care (Signed)
  Problem: Education: Goal: Knowledge of General Education information will improve Description: Including pain rating scale, medication(s)/side effects and non-pharmacologic comfort measures Outcome: Progressing   Problem: Health Behavior/Discharge Planning: Goal: Ability to manage health-related needs will improve Outcome: Progressing   Problem: Safety: Goal: Ability to remain free from injury will improve Outcome: Progressing   Problem: Skin Integrity: Goal: Risk for impaired skin integrity will decrease Outcome: Progressing   Problem: Urinary Elimination: Goal: Signs and symptoms of infection will decrease Outcome: Progressing

## 2019-06-29 NOTE — Progress Notes (Signed)
Spoke with patient's husband Cheryl Arnold and updated him on plan of care. We discussed disposition pending PT and he is agreeable to any recommendations per PT evaluation.

## 2019-06-29 NOTE — Progress Notes (Signed)
[facility]   PATIENT NAME: Cheryl MediciGlenda Arnold    MR#:  [id number]  DATE OF BIRTH:  1946/07/19  SUBJECTIVE:   Chief Complaint  Patient presents with  . Fall   Patient is seen at the bedside. Pleasantly confused but easily re-oriented. Denies any complaints.  REVIEW OF SYSTEMS:  ROS Unable to obtain reliable ROS due to underlying dementia  DRUG ALLERGIES:  No Active Allergies VITALS:  Blood pressure (!) 154/93, pulse 81, temperature 98.2 F (36.8 C), temperature source Oral, resp. rate 16, height 5\' 3"  (1.6 m), weight 54.4 kg, SpO2 99 %. PHYSICAL EXAMINATION:   GENERAL:  73 y.o.-year-old patient lying in the bed with no acute distress.  EYES: Pupils equal, round, reactive to light and accommodation. No scleral icterus. Extraocular muscles intact.  HEENT: Head atraumatic, normocephalic. Oropharynx and nasopharynx clear.  NECK:  Supple, no jugular venous distention. No thyroid enlargement, no tenderness.  LUNGS: Normal breath sounds bilaterally, no wheezing, rales,rhonchi or crepitation. No use of accessory muscles of respiration.  CARDIOVASCULAR: S1, S2 normal. No murmurs, rubs, or gallops.  ABDOMEN: Soft, nontender, nondistended. Bowel sounds present. No organomegaly or mass.  EXTREMITIES: No pedal edema, cyanosis, or clubbing. No rash or lesions. + pedal pulses MUSCULOSKELETAL: Normal bulk, and power was 5+ grip and elbow, knee, and ankle flexion and extension bilaterally.  NEUROLOGIC:Alert and oriented x 1. CN 2-12 intact. Sensation to light touch and cold stimuli intact bilaterally. Babinski is downgoing. DTR's (biceps, patellar, and achilles) 2+ and symmetric throughout. Gait not tested due to safety concern. PSYCHIATRIC: The patient is alert and oriented x 1. Pleasantly confused SKIN: No obvious rash, lesion, or ulcer.   DATA REVIEWED:  LABORATORY PANEL:  Female CBC Recent Labs  Lab 06/29/19 0546  WBC 7.6  HGB 11.3*  HCT 34.8*   PLT 262   ------------------------------------------------------------------------------------------------------------------ Chemistries  Recent Labs  Lab 06/28/19 0846 06/29/19 0546  NA 141 143  K 3.4* 2.8*  CL 107 115*  CO2 20* 18*  GLUCOSE 111* 88  BUN 20 17  CREATININE 0.62 0.56  CALCIUM 8.9 8.4*  MG  --  2.0  AST 50*  --   ALT 21  --   ALKPHOS 80  --   BILITOT 1.5*  --    RADIOLOGY:  Ct Angio Head W Or Wo Contrast  Addendum Date: 06/28/2019   ADDENDUM REPORT: 06/28/2019 16:42 ADDENDUM: Impression #1 discussed by telephone with hospitalist Dr. Odella AquasSenani on 06/28/2019 at 1638 hours. He advises the patient's respiratory status is currently normal, and we discussed follow-up portable versus two-view chest radiographs (as the patient will tolerate), rather than Chest CT at this time. Electronically Signed   By: Odessa FlemingH  Hall M.D.   On: 06/28/2019 16:42   Result Date: 06/28/2019 CLINICAL DATA:  73 year old female found down. Recent falls. EXAM: CT ANGIOGRAPHY HEAD AND NECK TECHNIQUE: Multidetector CT imaging of the head and neck was performed using the standard protocol during bolus administration of intravenous contrast. Multiplanar CT image reconstructions and MIPs were obtained to evaluate the vascular anatomy. Carotid stenosis measurements (when applicable) are obtained utilizing NASCET criteria, using the distal internal carotid diameter as the denominator. CONTRAST:  75mL OMNIPAQUE IOHEXOL 350 MG/ML SOLN COMPARISON:  Brain MRI 04/06/2016, head CT 01/15/2008. FINDINGS: CT HEAD Brain: Chronic left MCA territory cortical and subcortical white matter encephalomalacia at the left superior frontal gyrus and anterior corona radiata are stable since 2017. Stable cerebral volume. Stable chronic infarct in the posterior  right cerebellum. Mild cerebral white matter hypodensity elsewhere. No midline shift, ventriculomegaly, mass effect, evidence of mass lesion, intracranial hemorrhage or evidence of  cortically based acute infarction. Calvarium and skull base: No acute osseous abnormality identified. Paranasal sinuses: Visualized paranasal sinuses and mastoids are stable and well pneumatized. Orbits: Visualized orbits and scalp soft tissues are within normal limits. CTA NECK Skeleton: Multilevel degenerative changes in the cervical spine. No acute osseous abnormality identified. Upper chest: There is a trace right apical pneumothorax (series 9, image 31) versus unusual appearance of paraseptal emphysema. No right upper rib fracture is identified. There is otherwise mild apical lung scarring. No superior mediastinal lymphadenopathy. Other neck: No neck mass or lymphadenopathy. Aortic arch: Ectatic aortic arch with 3 vessel configuration. Calcified aortic atherosclerosis. Right carotid system: Tortuous brachiocephalic artery and proximal right CCA without stenosis. Minimal plaque at the right carotid bifurcation. Tortuous right ICA just below the skull base without stenosis. Left carotid system: Tortuous proximal left CCA without stenosis. Mild calcified plaque mostly at the ECA origin. Tortuous cervical left ICA without stenosis. Vertebral arteries: Tortuous proximal right subclavian artery with some calcified plaque but no stenosis. The right vertebral artery is non dominant with a normal origin and remains diminutive but patent to the skull base. The right V2 segment is tortuous. No proximal left subclavian artery stenosis despite soft and calcified plaque. Dominant left vertebral artery origin is patent without stenosis. Tortuous left V1 and V2 segments. Patent left vertebral artery to the skull base without stenosis. CTA HEAD Posterior circulation: Dominant left vertebral artery with mild tortuosity and no stenosis. Normal left PICA origin. The non dominant right vertebral functionally terminates in PICA. Tortuous basilar artery without stenosis. The basilar functionally terminates in the superior cerebellar  arteries, there is a small left P1. The basilar tip is mildly ectatic but there is no discrete aneurysm. Fetal type bilateral PCA origins, especially the right. Bilateral PCA branches are patent. There is mild multifocal irregularity and stenosis of the left PCA branches (series 16, image 21). Anterior circulation: Both ICA siphons are patent and tortuous. On the left there is mild to moderate calcified plaque with mild supraclinoid stenosis. Normal left posterior communicating artery origin. On the right there is tortuosity and mild to moderate calcified plaque with mild supraclinoid stenosis. Normal right posterior communicating artery origin. Patent carotid termini with mildly ectatic MCA and ACA origins. Anterior communicating artery is diminutive. Bilateral ACA branches are normal aside from tortuosity. Left MCA M1 segment and bifurcation are patent without stenosis. Left MCA branches are within normal limits. Right MCA M1 segment and trifurcation are patent without stenosis. Right MCA branches are within normal limits. Venous sinuses: Early contrast timing but the major dural venous sinuses appear to be patent. Anatomic variants: Dominant left vertebral artery, the right functionally terminates in PICA. Fetal type bilateral PCA origins. Review of the MIP images confirms the above findings IMPRESSION: 1. Possible trace right apically pneumothorax, versus unusual appearance of paraseptal emphysema. No right upper rib fracture is identified. Query recent trauma, recommend PA and lateral chest radiographs. 2. Negative for emergent large vessel occlusion. And no hemodynamically significant arterial stenosis in the head or neck. 3. Positive for generalized arterial tortuosity in the head and neck 4. Stable CT appearance of the brain since 2017. 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 5. Aortic Atherosclerosis (ICD10-I70.0). Electronically Signed: By: Odessa Fleming M.D. On: 06/28/2019 16:29   Dg Chest 2  View  Result Date: 06/28/2019 CLINICAL DATA:  Evaluate  for pneumothorax. EXAM: CHEST - 2 VIEW COMPARISON:  None. FINDINGS: Cardiomegaly. Tortuosity of the thoracic aorta. No large area pulmonary consolidation. No pleural effusion or pneumothorax. Scoliotic curvature of the thoracolumbar spine. Degenerative changes. IMPRESSION: Cardiomegaly. No acute cardiopulmonary process. Electronically Signed   By: Annia Beltrew  Davis M.D.   On: 06/28/2019 17:28   Dg Ankle Complete Right  Result Date: 06/28/2019 CLINICAL DATA:  Right ankle pain after multiple falls. EXAM: RIGHT ANKLE - COMPLETE 3+ VIEW COMPARISON:  None. FINDINGS: There is no evidence of fracture, dislocation, or joint effusion. There is no evidence of arthropathy or other focal bone abnormality. Soft tissues are unremarkable. IMPRESSION: Negative. Electronically Signed   By: Lupita RaiderJames  Green Jr M.D.   On: 06/28/2019 09:39   Ct Angio Neck W Or Wo Contrast  Addendum Date: 06/28/2019   ADDENDUM REPORT: 06/28/2019 16:42 ADDENDUM: Impression #1 discussed by telephone with hospitalist Dr. Odella AquasSenani on 06/28/2019 at 1638 hours. He advises the patient's respiratory status is currently normal, and we discussed follow-up portable versus two-view chest radiographs (as the patient will tolerate), rather than Chest CT at this time. Electronically Signed   By: Odessa FlemingH  Hall M.D.   On: 06/28/2019 16:42   Result Date: 06/28/2019 CLINICAL DATA:  73 year old female found down. Recent falls. EXAM: CT ANGIOGRAPHY HEAD AND NECK TECHNIQUE: Multidetector CT imaging of the head and neck was performed using the standard protocol during bolus administration of intravenous contrast. Multiplanar CT image reconstructions and MIPs were obtained to evaluate the vascular anatomy. Carotid stenosis measurements (when applicable) are obtained utilizing NASCET criteria, using the distal internal carotid diameter as the denominator. CONTRAST:  75mL OMNIPAQUE IOHEXOL 350 MG/ML SOLN COMPARISON:  Brain MRI  04/06/2016, head CT 01/15/2008. FINDINGS: CT HEAD Brain: Chronic left MCA territory cortical and subcortical white matter encephalomalacia at the left superior frontal gyrus and anterior corona radiata are stable since 2017. Stable cerebral volume. Stable chronic infarct in the posterior right cerebellum. Mild cerebral white matter hypodensity elsewhere. No midline shift, ventriculomegaly, mass effect, evidence of mass lesion, intracranial hemorrhage or evidence of cortically based acute infarction. Calvarium and skull base: No acute osseous abnormality identified. Paranasal sinuses: Visualized paranasal sinuses and mastoids are stable and well pneumatized. Orbits: Visualized orbits and scalp soft tissues are within normal limits. CTA NECK Skeleton: Multilevel degenerative changes in the cervical spine. No acute osseous abnormality identified. Upper chest: There is a trace right apical pneumothorax (series 9, image 31) versus unusual appearance of paraseptal emphysema. No right upper rib fracture is identified. There is otherwise mild apical lung scarring. No superior mediastinal lymphadenopathy. Other neck: No neck mass or lymphadenopathy. Aortic arch: Ectatic aortic arch with 3 vessel configuration. Calcified aortic atherosclerosis. Right carotid system: Tortuous brachiocephalic artery and proximal right CCA without stenosis. Minimal plaque at the right carotid bifurcation. Tortuous right ICA just below the skull base without stenosis. Left carotid system: Tortuous proximal left CCA without stenosis. Mild calcified plaque mostly at the ECA origin. Tortuous cervical left ICA without stenosis. Vertebral arteries: Tortuous proximal right subclavian artery with some calcified plaque but no stenosis. The right vertebral artery is non dominant with a normal origin and remains diminutive but patent to the skull base. The right V2 segment is tortuous. No proximal left subclavian artery stenosis despite soft and calcified  plaque. Dominant left vertebral artery origin is patent without stenosis. Tortuous left V1 and V2 segments. Patent left vertebral artery to the skull base without stenosis. CTA HEAD Posterior circulation: Dominant left vertebral artery with  mild tortuosity and no stenosis. Normal left PICA origin. The non dominant right vertebral functionally terminates in PICA. Tortuous basilar artery without stenosis. The basilar functionally terminates in the superior cerebellar arteries, there is a small left P1. The basilar tip is mildly ectatic but there is no discrete aneurysm. Fetal type bilateral PCA origins, especially the right. Bilateral PCA branches are patent. There is mild multifocal irregularity and stenosis of the left PCA branches (series 16, image 21). Anterior circulation: Both ICA siphons are patent and tortuous. On the left there is mild to moderate calcified plaque with mild supraclinoid stenosis. Normal left posterior communicating artery origin. On the right there is tortuosity and mild to moderate calcified plaque with mild supraclinoid stenosis. Normal right posterior communicating artery origin. Patent carotid termini with mildly ectatic MCA and ACA origins. Anterior communicating artery is diminutive. Bilateral ACA branches are normal aside from tortuosity. Left MCA M1 segment and bifurcation are patent without stenosis. Left MCA branches are within normal limits. Right MCA M1 segment and trifurcation are patent without stenosis. Right MCA branches are within normal limits. Venous sinuses: Early contrast timing but the major dural venous sinuses appear to be patent. Anatomic variants: Dominant left vertebral artery, the right functionally terminates in PICA. Fetal type bilateral PCA origins. Review of the MIP images confirms the above findings IMPRESSION: 1. Possible trace right apically pneumothorax, versus unusual appearance of paraseptal emphysema. No right upper rib fracture is identified. Query  recent trauma, recommend PA and lateral chest radiographs. 2. Negative for emergent large vessel occlusion. And no hemodynamically significant arterial stenosis in the head or neck. 3. Positive for generalized arterial tortuosity in the head and neck 4. Stable CT appearance of the brain since 2017. 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 5. Aortic Atherosclerosis (ICD10-I70.0). Electronically Signed: By: Odessa Fleming M.D. On: 06/28/2019 16:29   Dg Hip Unilat W Or Wo Pelvis 2-3 Views Right  Result Date: 06/28/2019 CLINICAL DATA:  Right hip pain after multiple falls. EXAM: DG HIP (WITH OR WITHOUT PELVIS) 2-3V RIGHT COMPARISON:  None. FINDINGS: There is no evidence of hip fracture or dislocation. There is no evidence of arthropathy or other focal bone abnormality. IMPRESSION: Negative. Electronically Signed   By: Lupita Raider M.D.   On: 06/28/2019 09:37   ASSESSMENT AND PLAN:   73 y.o. female 73 y.o. 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  1. Altered mental status -likely due to UTI in a patient with history of underlying dementia - stable no worsening symptoms - Check B12 TSH - CTA head and neck negative for acute intracranial abnormality  2. 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 Patient asymptomatic. - PT/OT consult  3. UTI - UA shows mild UTI - Urine culture pending - Continue IV antibiotics with ceftriaxone - IVFs  4. Hypokalemia - Repleted, mag normal - Recheck in am  5. History of CVA - ASA 81mg  PO daily  6. HLD + Goal  LDL<100 - Pravastatin 20mg  PO qhs   7. HTN = + Goal BP <140/90 PRN hydralazine  8. Vascular dementia with behavioral disturbances -* Continue Aricept  9. DVT  prophylaxis - Enoxaparin SubQ   All the records are reviewed and case discussed with Care Management/Social Worker. Management plans discussed with the patient, family and they are in agreement.  CODE STATUS: Full Code  TOTAL TIME TAKING CARE OF THIS PATIENT: 40 minutes.   More than 50% of the time was spent in counseling/coordination of care: YES  POSSIBLE D/C IN 2 DAYS, DEPENDING ON CLINICAL CONDITION.    06/29/2019 at 8:59 AM  Rufina Falco, DNP, FNP-BC [facility] Nurse Practitioner Between 7am to 6pm - Pager - [phone]  After 6pm go to [email or web address] - password [email or web address] [facility]  Office  [phone]  CC: Primary care physician; Derinda Late, MD  Note: This dictation was prepared with Dragon dictation along with smaller phrase technology. Any transcriptional errors that result from this process are unintentional.

## 2019-06-30 ENCOUNTER — Encounter: Payer: Self-pay | Admitting: Nurse Practitioner

## 2019-06-30 LAB — BASIC METABOLIC PANEL
Anion gap: 4 — ABNORMAL LOW (ref 5–15)
BUN: 15 mg/dL (ref 8–23)
CO2: 24 mmol/L (ref 22–32)
Calcium: 8.1 mg/dL — ABNORMAL LOW (ref 8.9–10.3)
Chloride: 109 mmol/L (ref 98–111)
Creatinine, Ser: 0.6 mg/dL (ref 0.44–1.00)
GFR calc Af Amer: >60 mL/min (ref >60)
GFR calc non Af Amer: >60 mL/min (ref >60)
Glucose, Bld: 109 mg/dL — ABNORMAL HIGH (ref 70–99)
Potassium: 4.1 mmol/L (ref 3.5–5.1)
Sodium: 137 mmol/L (ref 135–145)

## 2019-06-30 LAB — CBC WITH DIFFERENTIAL/PLATELET
Abs Immature Granulocytes: 0.08 10*3/uL — ABNORMAL HIGH (ref 0.00–0.07)
Basophils Absolute: 0 10*3/uL (ref 0.0–0.1)
Basophils Relative: 0 %
Eosinophils Absolute: 0.2 10*3/uL (ref 0.0–0.5)
Eosinophils Relative: 3 %
HCT: 33.1 % — ABNORMAL LOW (ref 36.0–46.0)
Hemoglobin: 11.1 g/dL — ABNORMAL LOW (ref 12.0–15.0)
Immature Granulocytes: 1 %
Lymphocytes Relative: 18 %
Lymphs Abs: 1.3 10*3/uL (ref 0.7–4.0)
MCH: 29.9 pg (ref 26.0–34.0)
MCHC: 33.5 g/dL (ref 30.0–36.0)
MCV: 89.2 fL (ref 80.0–100.0)
Monocytes Absolute: 0.7 10*3/uL (ref 0.1–1.0)
Monocytes Relative: 10 %
Neutro Abs: 4.7 10*3/uL (ref 1.7–7.7)
Neutrophils Relative %: 68 %
Platelets: 270 10*3/uL (ref 150–400)
RBC: 3.71 MIL/uL — ABNORMAL LOW (ref 3.87–5.11)
RDW: 15.5 % (ref 11.5–15.5)
WBC: 6.9 10*3/uL (ref 4.0–10.5)
nRBC: 0 % (ref 0.0–0.2)

## 2019-06-30 MED ORDER — CEPHALEXIN 500 MG PO CAPS: 500.0000 mg | ORAL_CAPSULE | Freq: Two times a day (BID) | ORAL | 0 refills | Status: DC

## 2019-06-30 MED ORDER — CEPHALEXIN 500 MG PO CAPS: 500.0000 mg | ORAL_CAPSULE | Freq: Two times a day (BID) | ORAL | 0 refills | Status: AC

## 2019-06-30 NOTE — Progress Notes (Signed)
Received verbal order from Gardiner Barefoot NP to recheck metabolic panel for potassium, patient refused last dose of potassium.

## 2019-06-30 NOTE — Discharge Summary (Signed)
[place] [place] NAME: Cheryl Arnold    MR#:  [id number]  DATE OF BIRTH:  1946/11/23  DATE OF ADMISSION:  06/28/2019   ADMITTING PHYSICIAN: Lang Snow, NP  DATE OF DISCHARGE:06/30/19  PRIMARY CARE PHYSICIAN: Derinda Late, MD   ADMISSION DIAGNOSIS:   Advanced dementia ([place]) [F03.90] Cystitis [N30.90] Fall, initial encounter [W19.XXXA]  DISCHARGE DIAGNOSIS:   Active Problems:   Altered mental status   Pressure injury of skin  SECONDARY DIAGNOSIS:   Past Medical History:  Diagnosis Date  . Anxiety and depression   . CVA, old, cognitive deficits   . Hyperlipidemia   . Hypertension   . Mixed Alzheimer's and vascular dementia ([place])   . RA (rheumatoid arthritis) [facility])    HOSPITAL COURSE:   73 y.o. female 73 y.o.femalewith pertinent past medical history ofdepression, vascular dementia without behavioral disturbances, PCA stroke, anemia, hyperlipidemia, hypertension, RA, and bradycardia presenting to the ED with altered mental status and recurrent falls.  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 Patient asymptomatic. -Discharge with home health/PT/Aide /RN  3.UTI -UA shows mild UTI -Urine culture shows gram negative rods -Treated with ceftriaxone x 2 doses. Will discharge on Keflex for additional 5 days  4. Hypokalemia - Repleted, mag normal - Resolved  5. History of CVA - ASA 81mg  PO daily  6.HLD + Goal LDL<100 -Pravastatin 20mg  PO  qhs  7. HTN = + Goal BP <140/90 PRN hydralazine  8. Vascular dementia without behavioral disturbances -* Continue Aricept - Zoloft  DISCHARGE CONDITIONS:   Stable CONSULTS OBTAINED:   DRUG ALLERGIES:   No Active Allergies DISCHARGE MEDICATIONS:   Allergies as of 06/30/2019   No Active Allergies     Medication List    TAKE these medications   aspirin EC 81 MG tablet Take 81 mg by mouth daily.   cephALEXin 500 MG capsule Commonly known as: Keflex Take 1 capsule (500 mg total) by mouth 2 (two) times daily for 5 days. Start taking on: July 01, 2019   donepezil 10 MG tablet Commonly known as: ARICEPT Take 10 mg by mouth Nightly.   folic acid 1 MG tablet Commonly known as: FOLVITE Take 2 mg by mouth daily.   methotrexate 2.5 MG tablet Commonly known as: RHEUMATREX Take 4 tablets by mouth once a week.   omeprazole 20 MG capsule Commonly known as: PRILOSEC Take 20 mg by mouth daily.   pravastatin 20 MG tablet Commonly known as: PRAVACHOL Take 20 mg by mouth Nightly.   predniSONE 1 MG tablet Commonly known as: DELTASONE Take 2 mg by mouth daily.   sertraline 50 MG tablet Commonly known as: ZOLOFT Take 50 mg by mouth daily.      DISCHARGE INSTRUCTIONS:    DIET:   Low fat, Low cholesterol diet  ACTIVITY:   Activity as tolerated  OXYGEN:   Home Oxygen: No.  Oxygen Delivery: room air  DISCHARGE LOCATION:   nursing home   If you experience worsening of your admission symptoms, develop shortness of breath, life threatening emergency, suicidal or homicidal thoughts  you must seek medical attention immediately by calling 911 or calling your MD immediately  if symptoms less severe.  You Must read complete instructions/literature along with all the possible adverse reactions/side effects for all the Medicines you take and that have been prescribed to you. Take any new Medicines after you have completely understood and accpet all the possible adverse  reactions/side effects.   Please note  You were cared for by a hospitalist during your hospital stay. If you have any questions about your discharge medications or the care you received while you were in the hospital after you are discharged, you can call the unit and asked to speak with the hospitalist on call if the hospitalist that took care of you is not available. Once you are discharged, your primary care physician will handle any further medical issues. Please note that NO REFILLS for any discharge medications will be authorized once you are discharged, as it is imperative that you return to your primary care physician (or establish a relationship with a primary care physician if you do not have one) for your aftercare needs so that they can reassess your need for medications and monitor your lab values.  On the day of Discharge:  VITAL SIGNS:   Blood pressure (!) 143/75, pulse 60, temperature 98.2 F (36.8 C), temperature source Oral, resp. rate 18, height 5\' 3"  (1.6 m), weight 54.4 kg, SpO2 97 %.  PHYSICAL EXAMINATION:   GENERAL:  73 y.o.-year-old patient lying in the bed with no acute distress.  EYES: Pupils equal, round, reactive to light and accommodation. No scleral icterus. Extraocular muscles intact.  HEENT: Head atraumatic, normocephalic. Oropharynx and nasopharynx clear.  NECK:  Supple, no jugular venous distention. No thyroid enlargement, no tenderness.  LUNGS: Normal breath sounds bilaterally, no wheezing, rales,rhonchi or crepitation. No use of accessory muscles of respiration.  CARDIOVASCULAR: S1, S2 normal. No murmurs, rubs, or gallops.  ABDOMEN: Soft, non-tender, non-distended. Bowel sounds present. No organomegaly or mass.  EXTREMITIES: No pedal edema, cyanosis, or clubbing.  NEUROLOGIC: Cranial nerves II through XII are intact. Muscle strength 5/5 in all extremities. Sensation intact. Gait not checked.  PSYCHIATRIC: The patient is alert and oriented x 1.  SKIN: No  obvious rash, lesion, or ulcer.   DATA REVIEW:   CBC Recent Labs  Lab 06/30/19 0421  WBC 6.9  HGB 11.1*  HCT 33.1*  PLT 270    Chemistries  Recent Labs  Lab 06/28/19 0846 06/29/19 0546 06/30/19 0421  NA 141 143 137  K 3.4* 2.8* 4.1  CL 107 115* 109  CO2 20* 18* 24  GLUCOSE 111* 88 109*  BUN 20 17 15   CREATININE 0.62 0.56 0.60  CALCIUM 8.9 8.4* 8.1*  MG  --  2.0  --   AST 50*  --   --   ALT 21  --   --   ALKPHOS 80  --   --   BILITOT 1.5*  --   --      Microbiology Results  Results for orders placed or performed during the hospital encounter of 06/28/19  Urine culture     Status: Abnormal (Preliminary result)   Collection Time: 06/28/19  8:47 AM   Specimen: Urine, Random  Result Value Ref Range Status   Specimen Description   Final    URINE, RANDOM Performed at [facility], [address], [place], [address] [place]    Special Requests   Final    NONE Performed at [facility]  [facility], [address], [place], [place] [address]    Culture (A)  Final    >=100,000 COLONIES/mL GRAM NEGATIVE RODS CULTURE REINCUBATED FOR BETTER GROWTH Performed at [facility], [address], [place], [place] [address]    Report Status PENDING  Incomplete    RADIOLOGY:  No results found.   Management plans discussed with the patient, family and they are in agreement.  CODE STATUS:     Code Status Orders  (From admission, onward)         Start     Ordered   06/28/19 1342  Full code  Continuous     06/28/19 1351        Code Status History    This patient has a current code status but no historical code status.   Advance Care Planning Activity      TOTAL TIME TAKING CARE OF THIS PATIENT: 38 minutes.   Webb Silversmith, DNP, FNP-BC [facility] Nurse Practitioner Between 7am to 6pm - Pager - [phone]  After 6pm go to [email or web address] - password [email or web address]  [facility]  Office  [phone]  CC: Primary care  physician; Kandyce Rud, MD  Note: This dictation was prepared with Dragon dictation along with smaller phrase technology. Any transcriptional errors that result from this process are unintentional.

## 2019-06-30 NOTE — Plan of Care (Signed)
  Problem: Education: Goal: Knowledge of General Education information will improve Description: Including pain rating scale, medication(s)/side effects and non-pharmacologic comfort measures Outcome: Progressing   Problem: Health Behavior/Discharge Planning: Goal: Ability to manage health-related needs will improve Outcome: Progressing   Problem: Safety: Goal: Ability to remain free from injury will improve Outcome: Progressing   Problem: Skin Integrity: Goal: Risk for impaired skin integrity will decrease Outcome: Progressing   Problem: Urinary Elimination: Goal: Signs and symptoms of infection will decrease Outcome: Progressing   

## 2019-06-30 NOTE — TOC Transition Note (Addendum)
Transition of Care [facility]) - CM/SW Discharge Note   Patient Details  Name: Cheryl Arnold MRN: [id number] Date of Birth: 08-24-1946  Transition of Care [facility]) CM/SW Contact:  Eshaan Titzer, Lenice Llamas Phone Number: [phone]  06/30/2019, 10:13 AM   Clinical Narrative: Per hospitalist NP patient is medically stable for D/C today. Patient has dementia and is confused so Clinical Education officer, museum (CSW) contacted her husband Shanicka Oldenkamp to complete assessment. Per husband they live in [place] and have been married for a long time. Per husband patient's insurance is paying for an aide 3 days per week. The aide comes Mondays, Wednesdays and Fridays from 9 am to 1 pm. CSW explained to husband that he can call the agency the aide is working for and request more hours. CSW also discussed long term care placement at an ALF. Per husband he is not ready to place patient now and feels he can take care of her at home. CSW explained long term placement process and private pay vs. Medicaid. CSW explained to husband how to apply for long term care medicaid. Husband verbalized his understanding and is agreeable to home health. Patient does not have a preference of home health agency. Per Melissa with [facility] they can't accept patient. Per Helene Kelp with [facility] they can accept patient however it will be a delay in start of care. Per Helene Kelp they can start towards the end of the week Thursday 7/9 or Friday 7/10. Helene Kelp is aware of the 2 week protocol with [facility]. CSW contacted patient's husband and made him aware that [facility] can accept patient for home health and will start towards the end of the week. Husband verbalized his understanding and is agreeable to [facility] home [facility]. Per husband patient has a walker at home already. Husband is agreeable to pick patient up today once she is discharged. RN has agreed to call husband once patient is ready to be picked up. Please reconsult if future social  work needs arise. CSW signing off.     Final next level of care: Home w Home Health Services Barriers to Discharge: Barriers Resolved   Patient Goals and CMS Choice Patient states their goals for this hospitalization and ongoing recovery are:: To feel better   Choice offered to / list presented to : Spouse  Discharge Placement                       Discharge Plan and Services                          HH Arranged: PT, RN, Nurse's Aide, Social Work [employer] Agency: [facility] [employer] [facility] [employer] Date [place]: 06/30/19   Representative spoke with at [place]: [place] ([place]) Interventions     Readmission Risk Interventions No flowsheet data found.

## 2019-06-30 NOTE — Progress Notes (Signed)
Pt D/C to home via husband. IVs removed intact. Education completed with husband Bernadetta Roell over the phone. All questions answered. All belongings sent with pt.

## 2019-07-01 LAB — NOVEL CORONAVIRUS, NAA (HOSP ORDER, SEND-OUT TO REF LAB; TAT 18-24 HRS): SARS-CoV-2, NAA: NOT DETECTED

## 2019-07-01 LAB — URINE CULTURE: Culture: >=100000 — AB

## 2019-07-01 LAB — HIV ANTIBODY (ROUTINE TESTING W REFLEX): HIV Screen 4th Generation wRfx: NONREACTIVE

## 2019-07-03 ENCOUNTER — Ambulatory Visit: Payer: Medicare Other | Admitting: Podiatry

## 2019-07-28 ENCOUNTER — Encounter: Payer: Self-pay | Admitting: Podiatry

## 2019-07-28 ENCOUNTER — Ambulatory Visit (INDEPENDENT_AMBULATORY_CARE_PROVIDER_SITE_OTHER): Payer: Medicare Other | Admitting: Podiatry

## 2019-07-28 ENCOUNTER — Ambulatory Visit: Payer: Medicare Other | Admitting: Podiatry

## 2019-07-28 ENCOUNTER — Other Ambulatory Visit: Payer: Self-pay

## 2019-07-28 DIAGNOSIS — B351 Tinea unguium: Secondary | ICD-10-CM

## 2019-07-28 DIAGNOSIS — M79674 Pain in right toe(s): Secondary | ICD-10-CM

## 2019-07-28 DIAGNOSIS — M79675 Pain in left toe(s): Secondary | ICD-10-CM

## 2019-07-28 NOTE — Progress Notes (Signed)
Complaint:  Visit Type: Patient presents  to my office for  preventative foot care services. Complaint: Patient states" my nails have grown long and thick and become painful to walk and wear shoes".  Patient presents to the office with her friend who is caregiver.  The patient presents for preventative foot care services. No changes to ROS  Podiatric Exam: Vascular: dorsalis pedis and posterior tibial pulses are palpable bilateral. Capillary return is immediate. Temperature gradient is WNL. Skin turgor WNL  Sensorium: Normal Semmes Weinstein monofilament test. Normal tactile sensation bilaterally. Nail Exam: Pt has thick disfigured discolored nails with subungual debris hallux toenails  B/L. Ulcer Exam: There is no evidence of ulcer or pre-ulcerative changes or infection. Orthopedic Exam: Muscle tone and strength are WNL. No limitations in general ROM. No crepitus or effusions noted. Foot type and digits show no abnormalities. Bony prominences are unremarkable. Skin: No Porokeratosis. No infection or ulcers  Diagnosis:  Onychomycosis, , Pain in right toe, pain in left toes  Treatment & Plan Procedures and Treatment: Consent by patient was obtained for treatment procedures.   Debridement of mycotic and hypertrophic toenails, 1 through 5 bilateral and clearing of subungual debris. No ulceration, no infection noted.  Return Visit-Office Procedure: Patient instructed to return to the office for a follow up visit 3 months for continued evaluation and treatment.    Gardiner Barefoot DPM

## 2019-08-26 IMAGING — CR CHEST - 2 VIEW
2 series · 2 of 2 positions shown · non-contrast
Comparison: None.

CLINICAL DATA: Evaluate for pneumothorax.

EXAM:
CHEST - 2 VIEW

[chest lat]
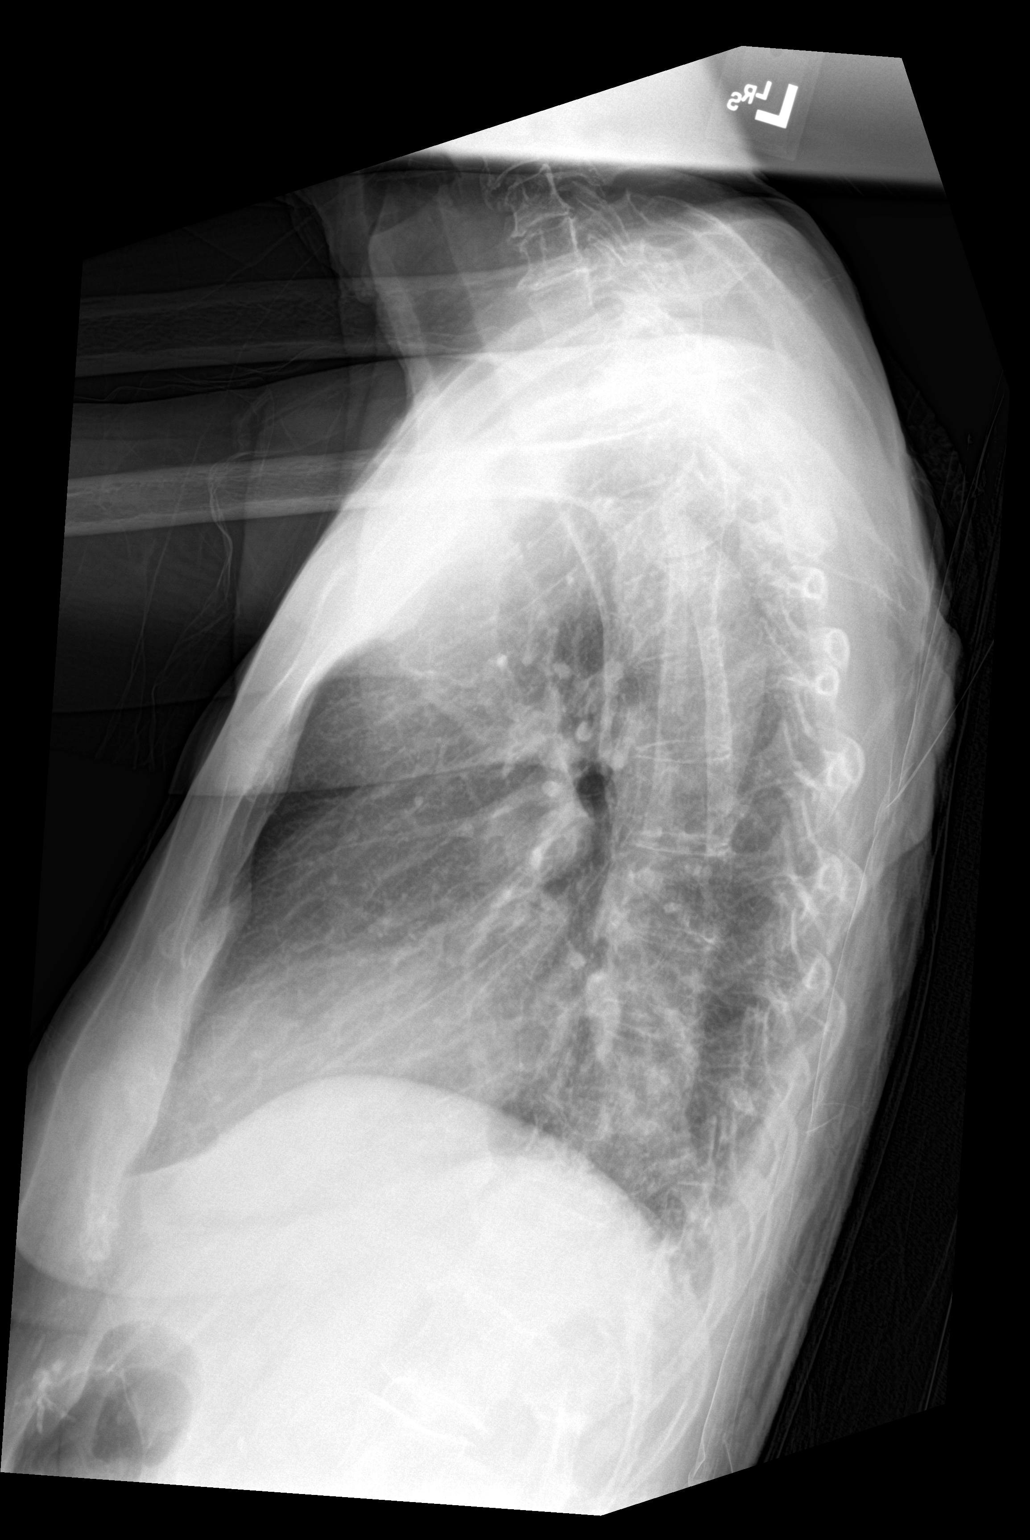

[chest ap]
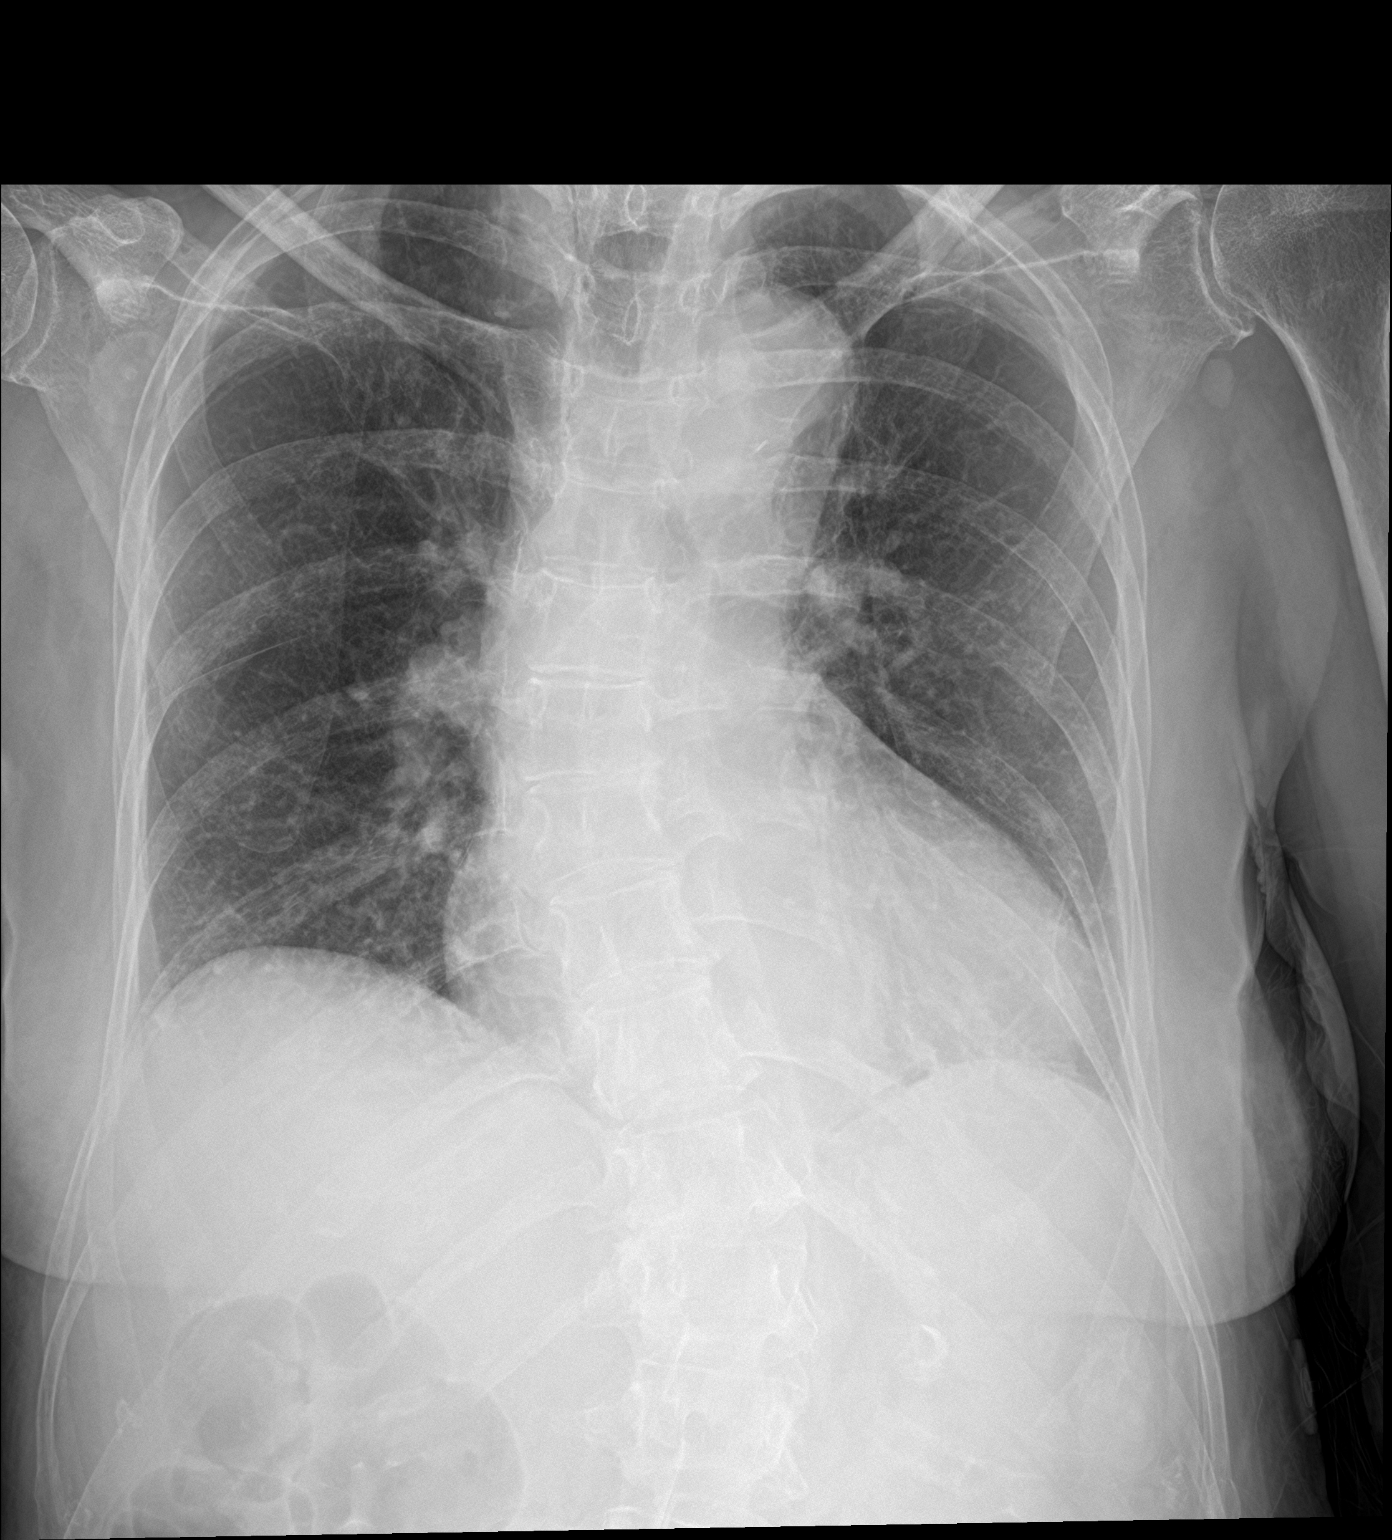

[2 of 2 positions shown; findings below may reference images not displayed]

FINDINGS: Cardiomegaly. Tortuosity of the thoracic aorta. No large area
pulmonary consolidation. No pleural effusion or pneumothorax.
Scoliotic curvature of the thoracolumbar spine. Degenerative
changes.
IMPRESSION: Cardiomegaly.

No acute cardiopulmonary process.

## 2019-08-26 IMAGING — CT CT ANGIOGRAPHY HEAD
1 of 10 series · 6 of 33 positions shown · IV contrast (APPLIED)
Comparison: Brain MRI 04/06/2016, head CT 01/15/2008.
COMPARISON: Brain MRI 04/06/2016, head CT 01/15/2008.

Addendum:
CLINICAL DATA: 72-year-old female found down. Recent falls.

EXAM:
CT ANGIOGRAPHY HEAD AND NECK
TECHNIQUE: Multidetector CT imaging of the head and neck was performed using
the standard protocol during bolus administration of intravenous
contrast. Multiplanar CT image reconstructions and MIPs were
obtained to evaluate the vascular anatomy. Carotid stenosis
measurements (when applicable) are obtained utilizing NASCET
criteria, using the distal internal carotid diameter as the
denominator.
CONTRAST:  75mL OMNIPAQUE IOHEXOL 350 MG/ML SOLN

[Series 11: ax thin · axial · 0.30mm/px · z∈[-399,-158]mm · 6 of 363 slices shown]
[im 52/363  soft-tissue]
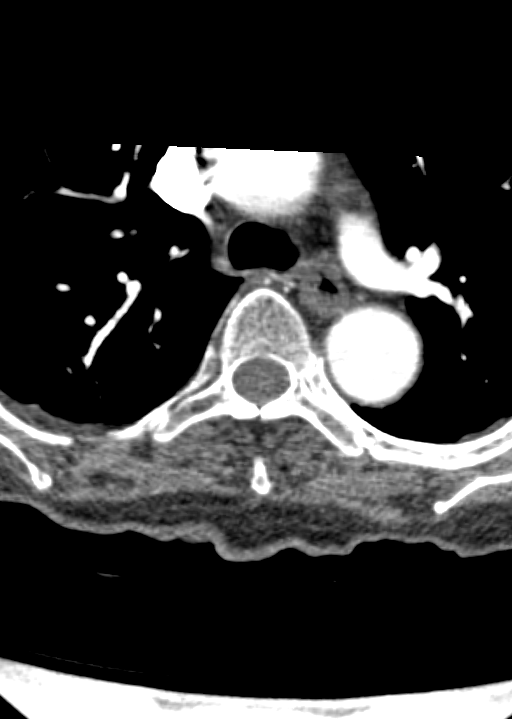
[im 104/363  bone]
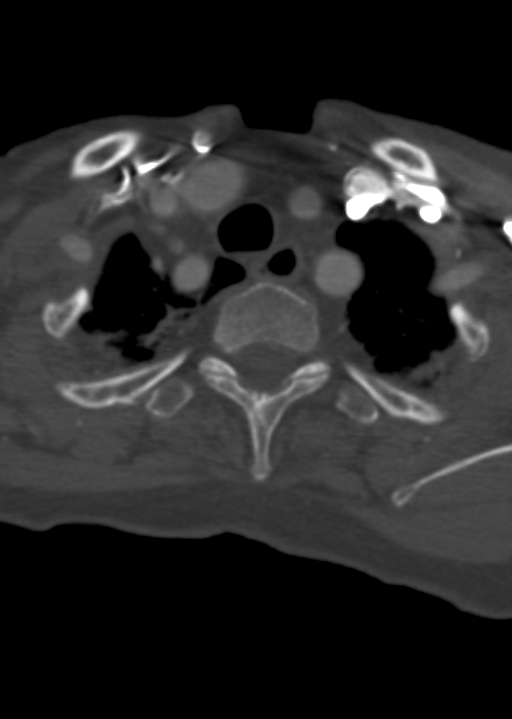
[im 156/363  soft-tissue]
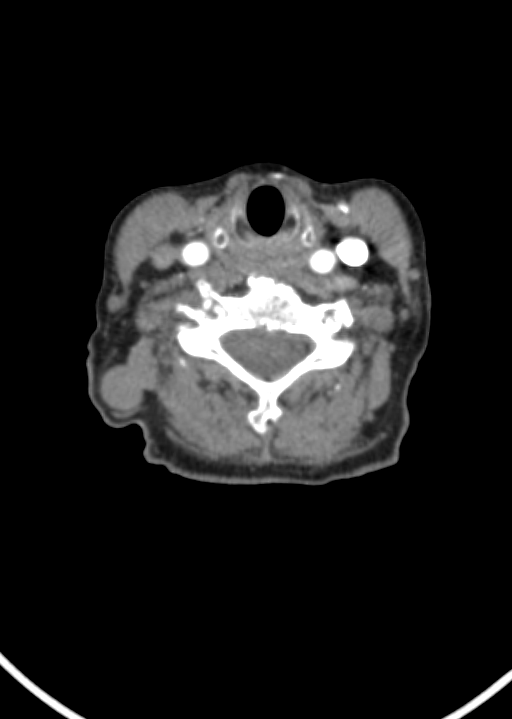
[im 207/363  bone]
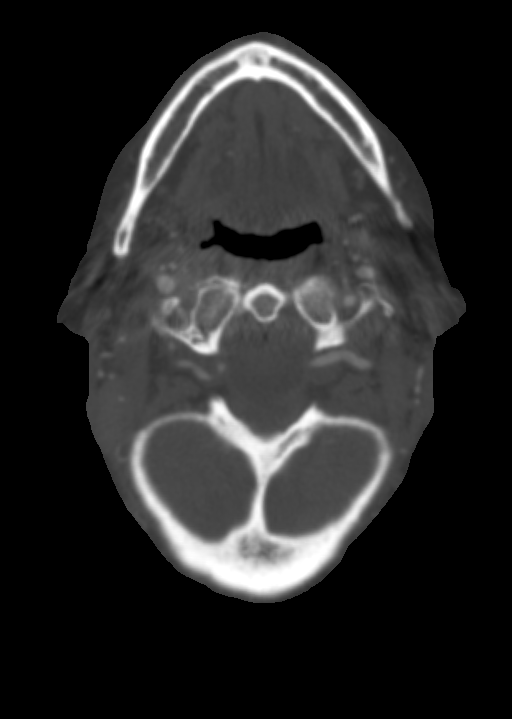
[im 259/363  soft-tissue]
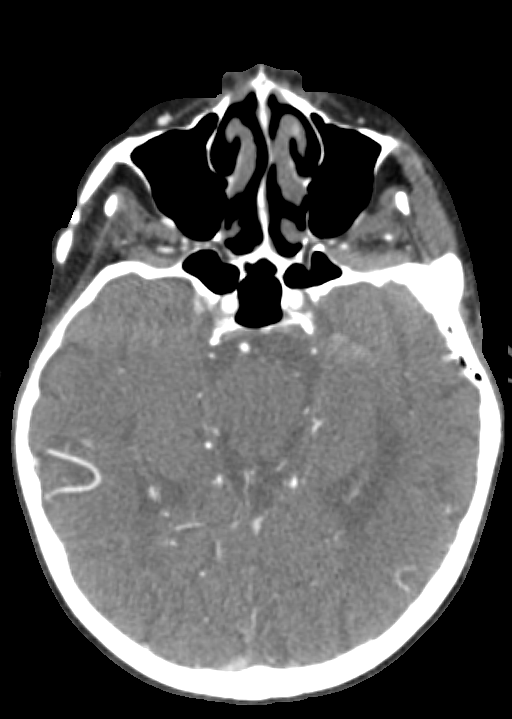
[im 311/363  bone]
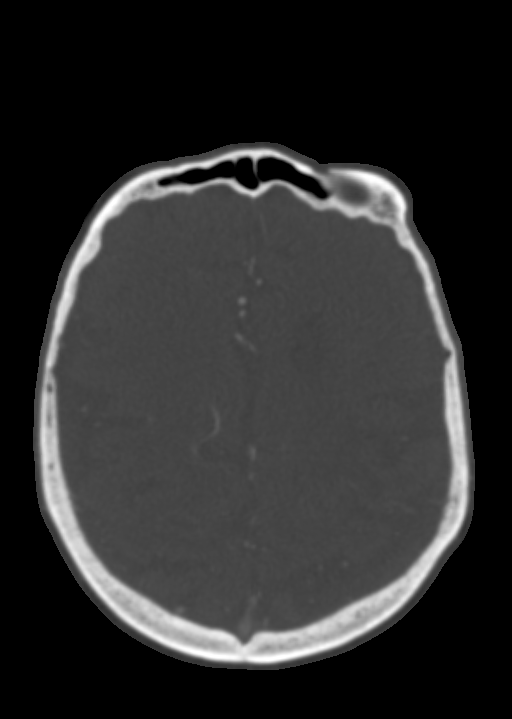

[6 of 33 positions shown; findings below may reference images not displayed]

FINDINGS: CT HEAD

Brain: Chronic left MCA territory cortical and subcortical white
matter encephalomalacia at the left superior frontal gyrus and
anterior corona radiata are stable since 6746. Stable cerebral
volume.

Stable chronic infarct in the posterior right cerebellum.

Mild cerebral white matter hypodensity elsewhere.

No midline shift, ventriculomegaly, mass effect, evidence of mass
lesion, intracranial hemorrhage or evidence of cortically based
acute infarction.

Calvarium and skull base: No acute osseous abnormality identified.

Paranasal sinuses: Visualized paranasal sinuses and mastoids are
stable and well pneumatized.

Orbits: Visualized orbits and scalp soft tissues are within normal
limits.

CTA NECK

Skeleton: Multilevel degenerative changes in the cervical spine. No
acute osseous abnormality identified.

Upper chest: There is a trace right apical pneumothorax (series 9,
image 31) versus unusual appearance of paraseptal emphysema. No
right upper rib fracture is identified.

There is otherwise mild apical lung scarring. No superior
mediastinal lymphadenopathy.

Other neck: No neck mass or lymphadenopathy.

Aortic arch: Ectatic aortic arch with 3 vessel configuration.
Calcified aortic atherosclerosis.

Right carotid system: Tortuous brachiocephalic artery and proximal
right CCA without stenosis. Minimal plaque at the right carotid
bifurcation. Tortuous right ICA just below the skull base without
stenosis.

Left carotid system: Tortuous proximal left CCA without stenosis.
Mild calcified plaque mostly at the ECA origin. Tortuous cervical
left ICA without stenosis.

Vertebral arteries:
Tortuous proximal right subclavian artery with some calcified plaque
but no stenosis. The right vertebral artery is non dominant with a
normal origin and remains diminutive but patent to the skull base.
The right V2 segment is tortuous.

No proximal left subclavian artery stenosis despite soft and
calcified plaque. Dominant left vertebral artery origin is patent
without stenosis. Tortuous left V1 and V2 segments. Patent left
vertebral artery to the skull base without stenosis.

CTA HEAD

Posterior circulation: Dominant left vertebral artery with mild
tortuosity and no stenosis. Normal left PICA origin. The non
dominant right vertebral functionally terminates in PICA.

Tortuous basilar artery without stenosis. The basilar functionally
terminates in the superior cerebellar arteries, there is a small
left P1. The basilar tip is mildly ectatic but there is no discrete
aneurysm. Fetal type bilateral PCA origins, especially the right.
Bilateral PCA branches are patent. There is mild multifocal
irregularity and stenosis of the left PCA branches (series 16, image
21).

Anterior circulation: Both ICA siphons are patent and tortuous. On
the left there is mild to moderate calcified plaque with mild
supraclinoid stenosis. Normal left posterior communicating artery
origin. On the right there is tortuosity and mild to moderate
calcified plaque with mild supraclinoid stenosis. Normal right
posterior communicating artery origin.

Patent carotid termini with mildly ectatic MCA and ACA origins.
Anterior communicating artery is diminutive. Bilateral ACA branches
are normal aside from tortuosity. Left MCA M1 segment and
bifurcation are patent without stenosis. Left MCA branches are
within normal limits. Right MCA M1 segment and trifurcation are
patent without stenosis. Right MCA branches are within normal
limits.

Venous sinuses: Early contrast timing but the major dural venous
sinuses appear to be patent.

Anatomic variants: Dominant left vertebral artery, the right
functionally terminates in PICA. Fetal type bilateral PCA origins.

Review of the MIP images confirms the above findings
IMPRESSION: 1. Possible trace right apically pneumothorax, versus unusual
appearance of paraseptal emphysema. No right upper rib fracture is
identified. Query recent trauma, recommend PA and lateral chest
radiographs.
2. Negative for emergent large vessel occlusion. And no
hemodynamically significant arterial stenosis in the head or neck.
3. Positive for generalized arterial tortuosity in the head and neck
4. Stable CT appearance of the brain since 6746. Chronic left MCA
and right cerebellar infarcts.
5. Aortic Atherosclerosis ([id number].C).

ADDENDUM:
Impression #1 discussed by telephone with hospitalist Dr. Conradt on
06/28/2019 at 6669 hours.

He advises the patient's respiratory status is currently normal, and
we discussed follow-up portable versus two-view chest radiographs
(as the patient will tolerate), rather than Chest CT at this time.

*** End of Addendum ***
FINDINGS: CT HEAD

Brain: Chronic left MCA territory cortical and subcortical white
matter encephalomalacia at the left superior frontal gyrus and
anterior corona radiata are stable since 6746. Stable cerebral
volume.

Stable chronic infarct in the posterior right cerebellum.

Mild cerebral white matter hypodensity elsewhere.

No midline shift, ventriculomegaly, mass effect, evidence of mass
lesion, intracranial hemorrhage or evidence of cortically based
acute infarction.

Calvarium and skull base: No acute osseous abnormality identified.

Paranasal sinuses: Visualized paranasal sinuses and mastoids are
stable and well pneumatized.

Orbits: Visualized orbits and scalp soft tissues are within normal
limits.

CTA NECK

Skeleton: Multilevel degenerative changes in the cervical spine. No
acute osseous abnormality identified.

Upper chest: There is a trace right apical pneumothorax (series 9,
image 31) versus unusual appearance of paraseptal emphysema. No
right upper rib fracture is identified.

There is otherwise mild apical lung scarring. No superior
mediastinal lymphadenopathy.

Other neck: No neck mass or lymphadenopathy.

Aortic arch: Ectatic aortic arch with 3 vessel configuration.
Calcified aortic atherosclerosis.

Right carotid system: Tortuous brachiocephalic artery and proximal
right CCA without stenosis. Minimal plaque at the right carotid
bifurcation. Tortuous right ICA just below the skull base without
stenosis.

Left carotid system: Tortuous proximal left CCA without stenosis.
Mild calcified plaque mostly at the ECA origin. Tortuous cervical
left ICA without stenosis.

Vertebral arteries:
Tortuous proximal right subclavian artery with some calcified plaque
but no stenosis. The right vertebral artery is non dominant with a
normal origin and remains diminutive but patent to the skull base.
The right V2 segment is tortuous.

No proximal left subclavian artery stenosis despite soft and
calcified plaque. Dominant left vertebral artery origin is patent
without stenosis. Tortuous left V1 and V2 segments. Patent left
vertebral artery to the skull base without stenosis.

CTA HEAD

Posterior circulation: Dominant left vertebral artery with mild
tortuosity and no stenosis. Normal left PICA origin. The non
dominant right vertebral functionally terminates in PICA.

Tortuous basilar artery without stenosis. The basilar functionally
terminates in the superior cerebellar arteries, there is a small
left P1. The basilar tip is mildly ectatic but there is no discrete
aneurysm. Fetal type bilateral PCA origins, especially the right.
Bilateral PCA branches are patent. There is mild multifocal
irregularity and stenosis of the left PCA branches (series 16, image
21).

Anterior circulation: Both ICA siphons are patent and tortuous. On
the left there is mild to moderate calcified plaque with mild
supraclinoid stenosis. Normal left posterior communicating artery
origin. On the right there is tortuosity and mild to moderate
calcified plaque with mild supraclinoid stenosis. Normal right
posterior communicating artery origin.

Patent carotid termini with mildly ectatic MCA and ACA origins.
Anterior communicating artery is diminutive. Bilateral ACA branches
are normal aside from tortuosity. Left MCA M1 segment and
bifurcation are patent without stenosis. Left MCA branches are
within normal limits. Right MCA M1 segment and trifurcation are
patent without stenosis. Right MCA branches are within normal
limits.

Venous sinuses: Early contrast timing but the major dural venous
sinuses appear to be patent.

Anatomic variants: Dominant left vertebral artery, the right
functionally terminates in PICA. Fetal type bilateral PCA origins.

Review of the MIP images confirms the above findings
IMPRESSION: 1. Possible trace right apically pneumothorax, versus unusual
appearance of paraseptal emphysema. No right upper rib fracture is
identified. Query recent trauma, recommend PA and lateral chest
radiographs.
2. Negative for emergent large vessel occlusion. And no
hemodynamically significant arterial stenosis in the head or neck.
3. Positive for generalized arterial tortuosity in the head and neck
4. Stable CT appearance of the brain since 6746. Chronic left MCA
and right cerebellar infarcts.
5. Aortic Atherosclerosis ([id number].C).

## 2019-08-26 IMAGING — CR RIGHT ANKLE - COMPLETE 3+ VIEW
3 series · 3 of 3 positions shown · non-contrast
Comparison: None.

CLINICAL DATA: Right ankle pain after multiple falls.

EXAM:
RIGHT ANKLE - COMPLETE 3+ VIEW

[ankle ap]
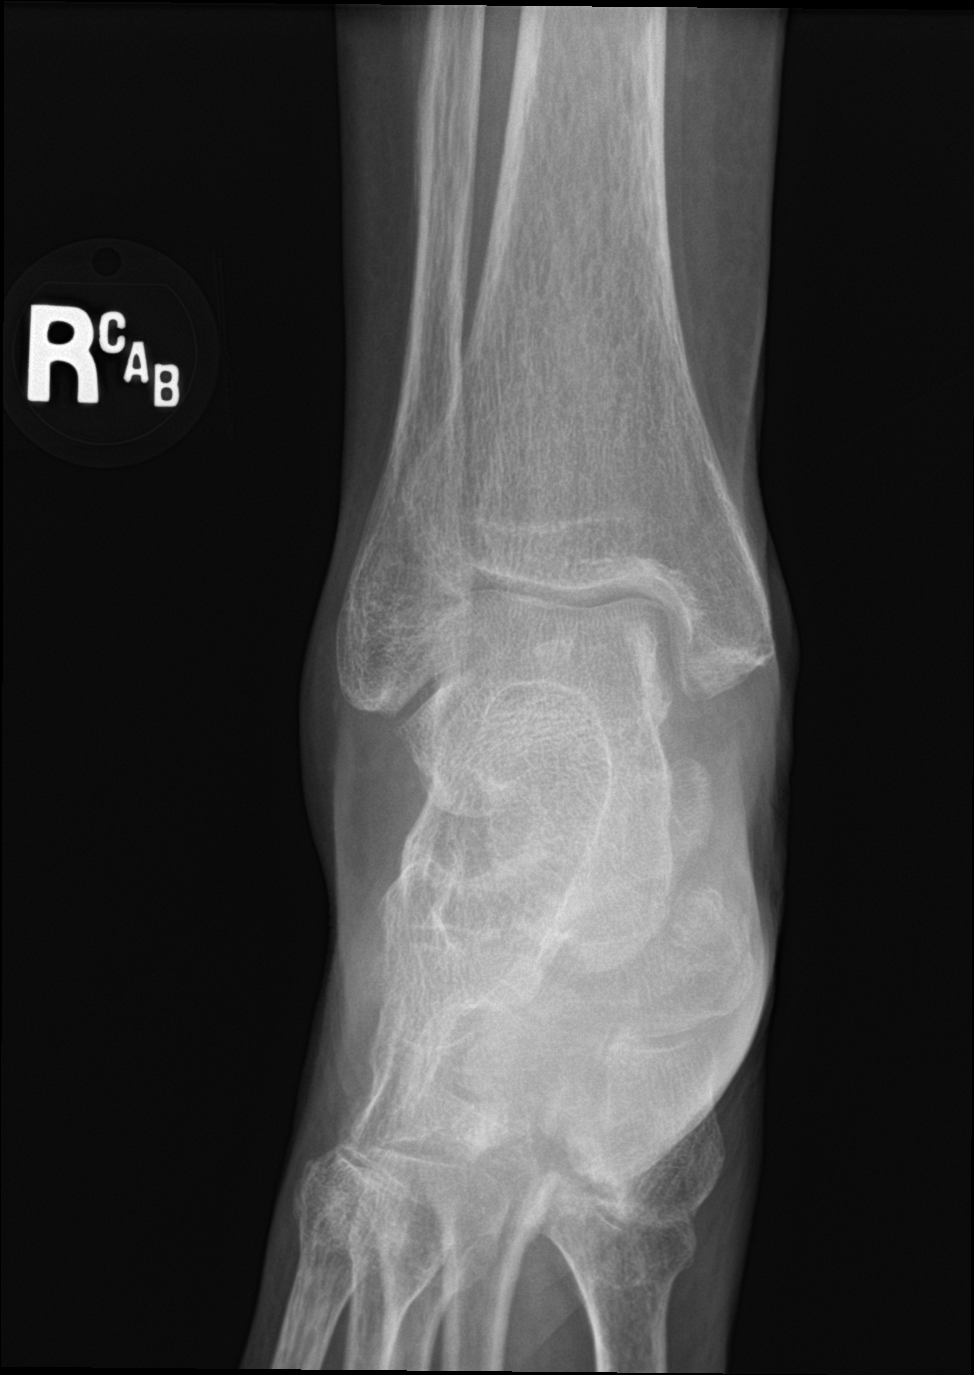

[ankle obl]
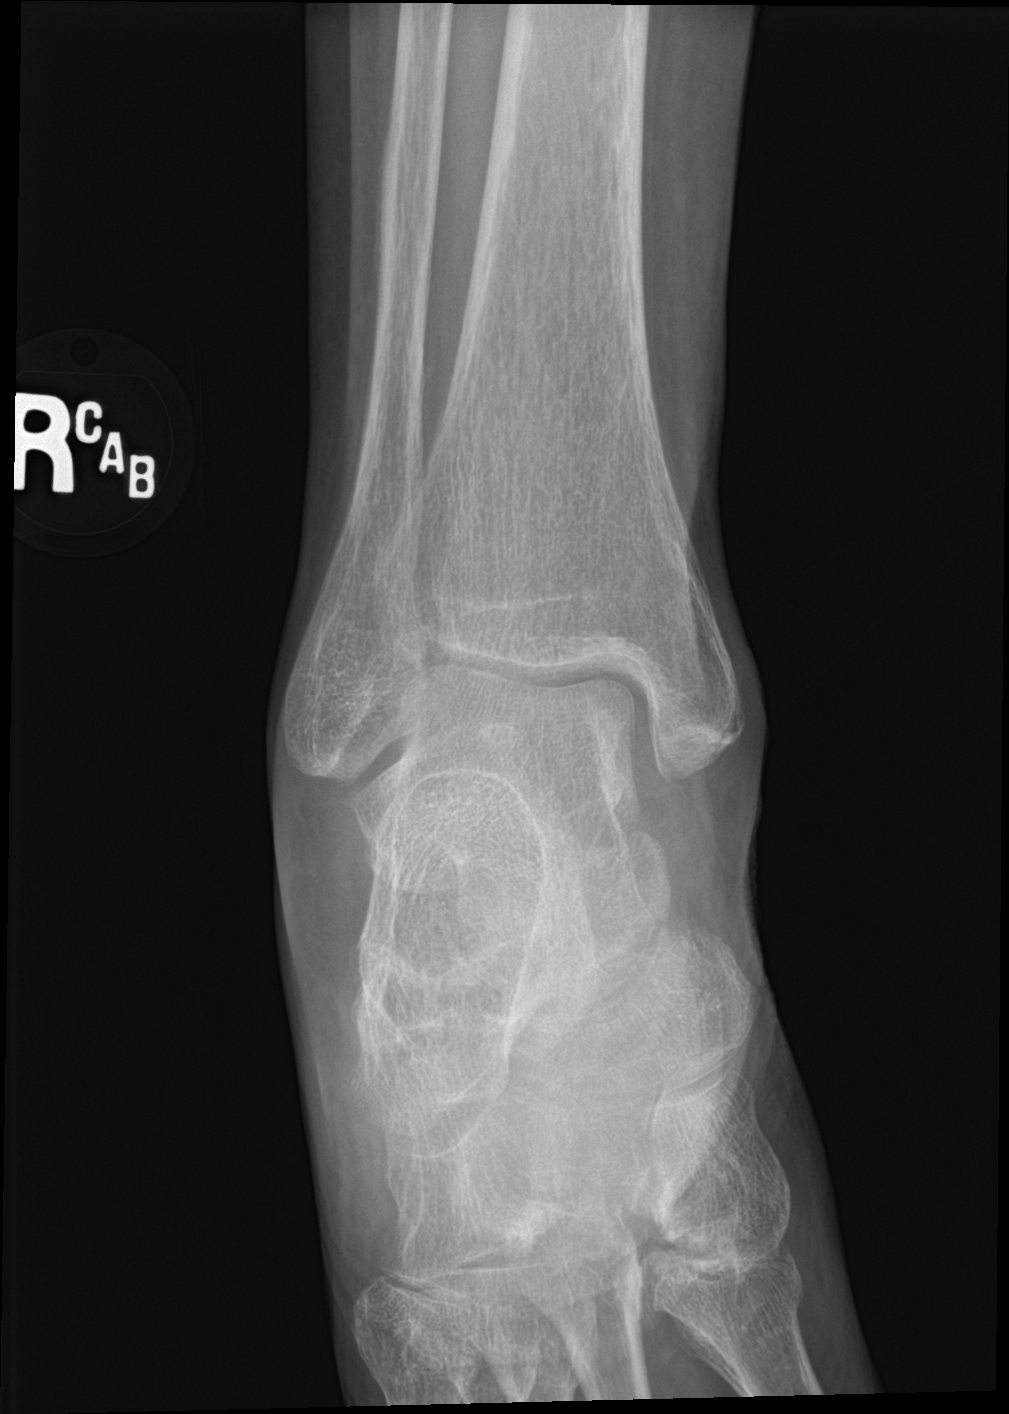

[ankle lat]
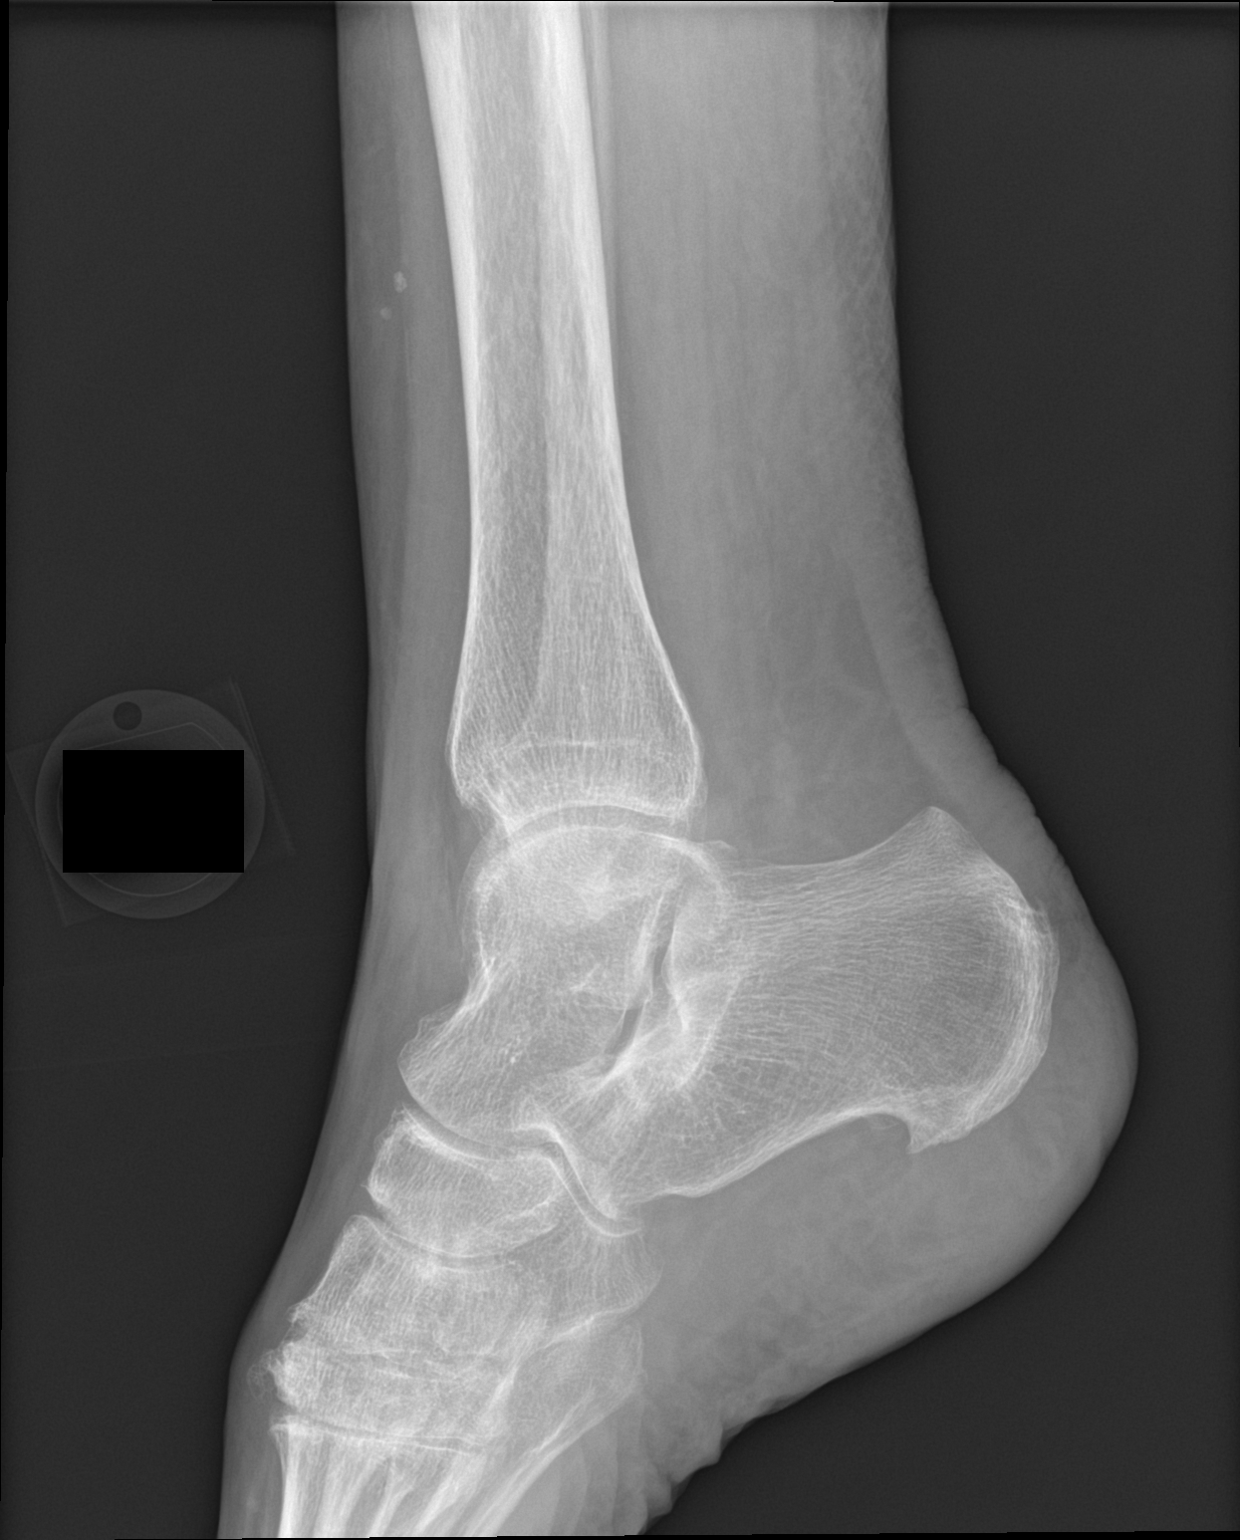

[3 of 3 positions shown; findings below may reference images not displayed]

FINDINGS: There is no evidence of fracture, dislocation, or joint effusion.
There is no evidence of arthropathy or other focal bone abnormality.
Soft tissues are unremarkable.
IMPRESSION: Negative.

## 2019-08-26 IMAGING — CR DG HIP (WITH OR WITHOUT PELVIS) 2-3V RIGHT
3 series · 3 of 3 positions shown · non-contrast
Comparison: None.

CLINICAL DATA: Right hip pain after multiple falls.

EXAM:
DG HIP (WITH OR WITHOUT PELVIS) 2-3V RIGHT

[pelvis ap]
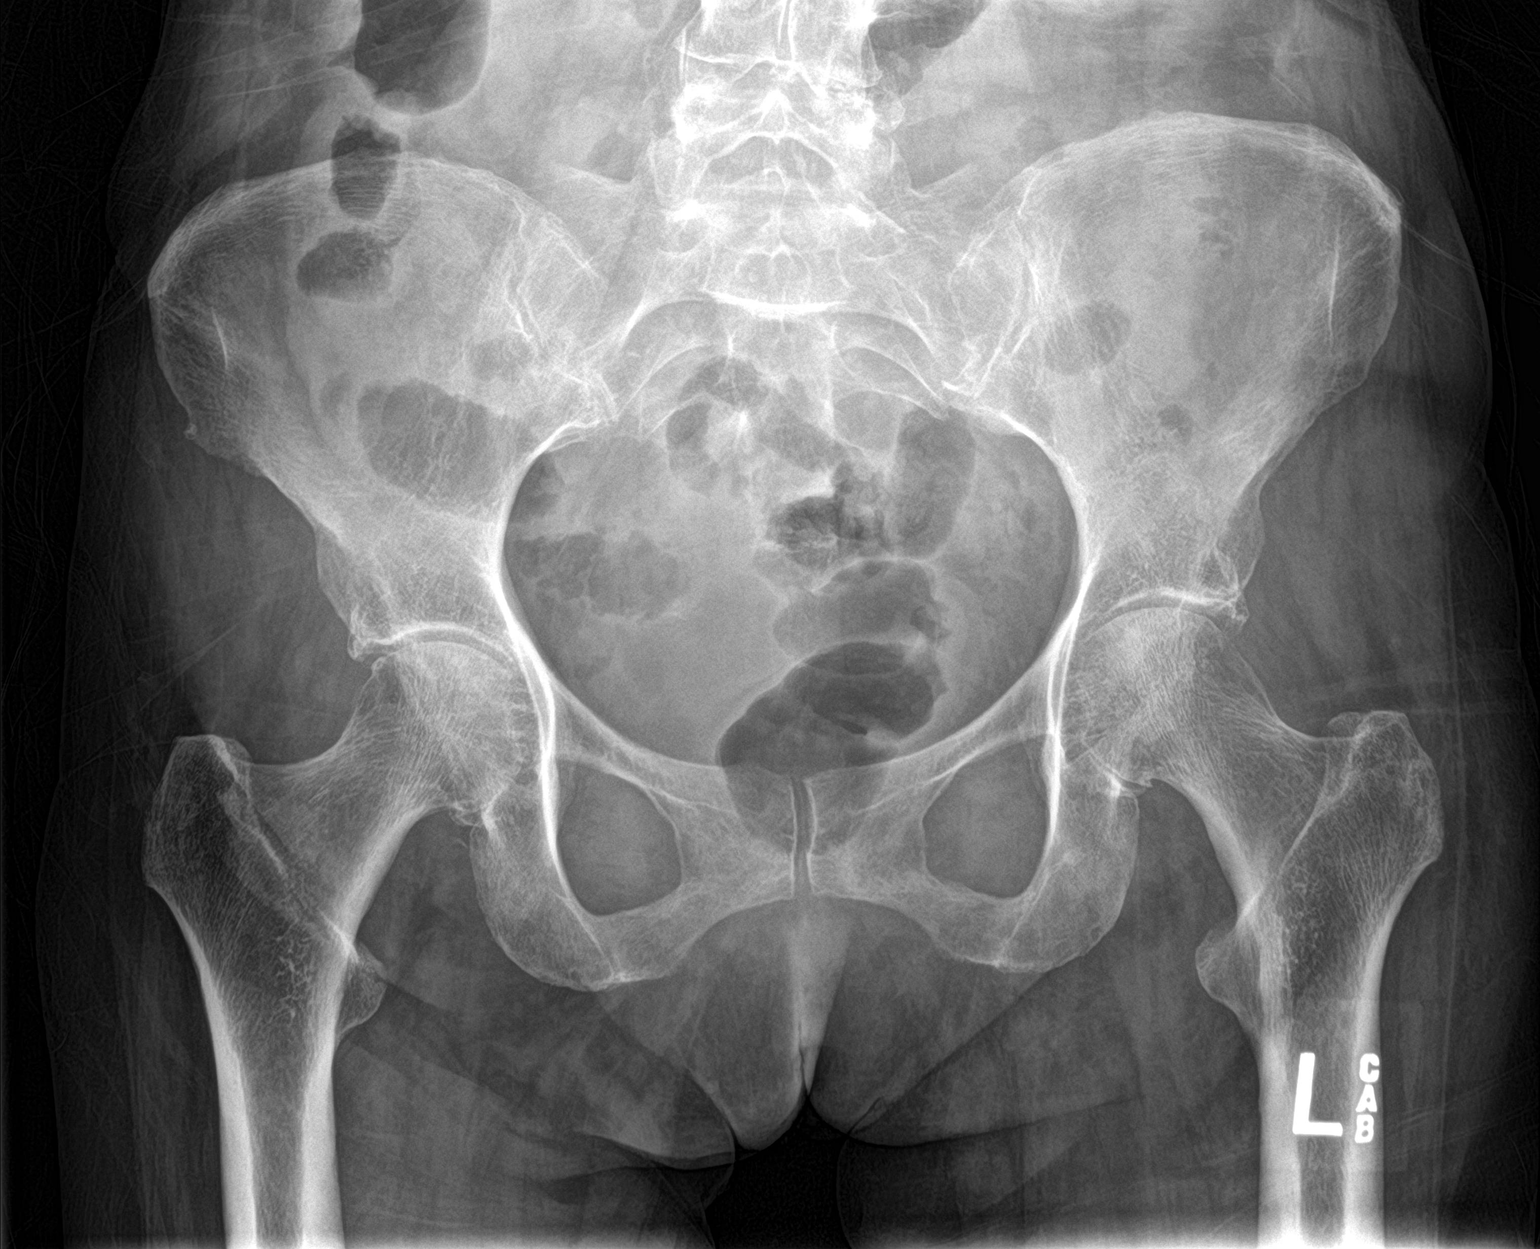

[hip ap]
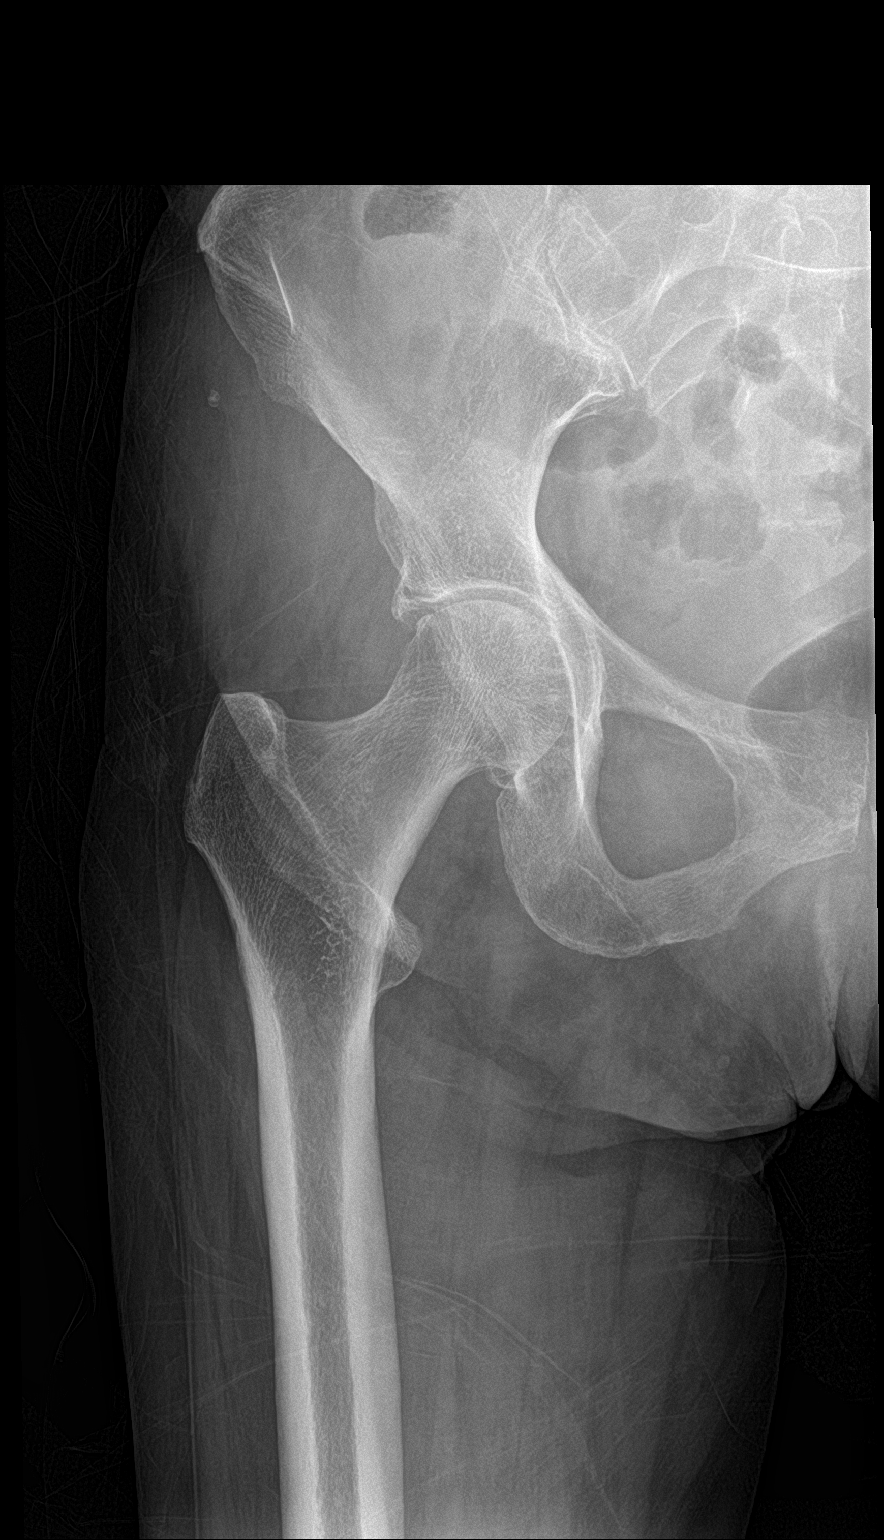

[hip lat]
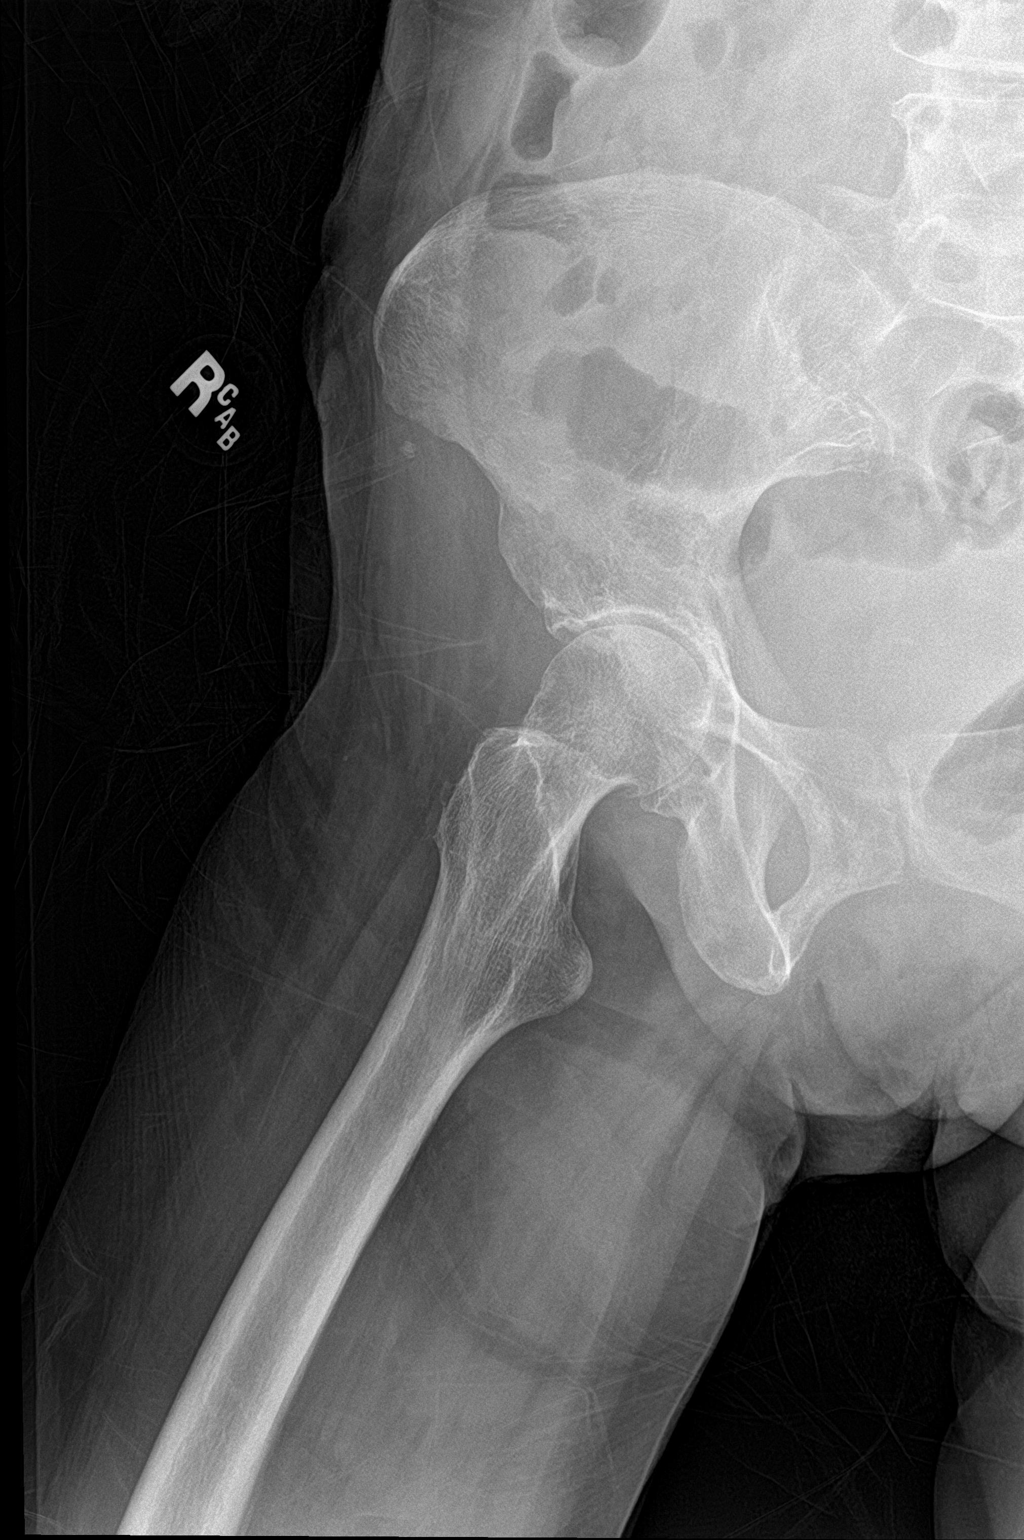

[3 of 3 positions shown; findings below may reference images not displayed]

FINDINGS: There is no evidence of hip fracture or dislocation. There is no
evidence of arthropathy or other focal bone abnormality.
IMPRESSION: Negative.

## 2019-10-01 ENCOUNTER — Other Ambulatory Visit: Payer: Self-pay

## 2019-10-01 DIAGNOSIS — Z20822 Contact with and (suspected) exposure to covid-19: Secondary | ICD-10-CM

## 2019-10-03 LAB — NOVEL CORONAVIRUS, NAA: SARS-CoV-2, NAA: NOT DETECTED

## 2019-10-27 ENCOUNTER — Ambulatory Visit: Payer: Medicare Other | Admitting: Podiatry

## 2019-10-30 ENCOUNTER — Ambulatory Visit: Payer: Medicare Other | Admitting: Podiatry

## 2020-01-15 ENCOUNTER — Ambulatory Visit: Payer: Medicare Other | Admitting: Podiatry

## 2020-03-01 ENCOUNTER — Ambulatory Visit (INDEPENDENT_AMBULATORY_CARE_PROVIDER_SITE_OTHER): Payer: Medicare Other | Admitting: Podiatry

## 2020-03-01 ENCOUNTER — Other Ambulatory Visit: Payer: Self-pay

## 2020-03-01 ENCOUNTER — Encounter: Payer: Self-pay | Admitting: Podiatry

## 2020-03-01 DIAGNOSIS — M79675 Pain in left toe(s): Secondary | ICD-10-CM | POA: Diagnosis not present

## 2020-03-01 DIAGNOSIS — M79674 Pain in right toe(s): Secondary | ICD-10-CM | POA: Diagnosis not present

## 2020-03-01 DIAGNOSIS — B351 Tinea unguium: Secondary | ICD-10-CM

## 2020-03-01 NOTE — Progress Notes (Signed)
Complaint:  Visit Type: Patient presents  to my office for  preventative foot care services. Complaint: Patient states" my nails have grown long and thick and become painful to walk and wear shoes".  Patient presents to the office with her friend who is caregiver.  The patient presents for preventative foot care services. No changes to ROS  Podiatric Exam: Vascular: dorsalis pedis and posterior tibial pulses are palpable bilateral. Capillary return is immediate. Temperature gradient is WNL. Skin turgor WNL  Venous stasis. Sensorium: Normal Semmes Weinstein monofilament test. Normal tactile sensation bilaterally. Nail Exam: Pt has thick disfigured discolored nails with subungual debris hallux toenails  B/L. Ulcer Exam: There is no evidence of ulcer or pre-ulcerative changes or infection. Orthopedic Exam: Muscle tone and strength are WNL. No limitations in general ROM. No crepitus or effusions noted. Foot type and digits show no abnormalities. Bony prominences are unremarkable. Skin: No Porokeratosis. No infection or ulcers  Diagnosis:  Onychomycosis, , Pain in right toe, pain in left toes  Treatment & Plan Procedures and Treatment: Consent by patient was obtained for treatment procedures.   Debridement of mycotic and hypertrophic toenails, 1 through 5 bilateral and clearing of subungual debris. No ulceration, no infection noted.  Laceration lower left leg from her toenails right foot.  Neosporin/DSD.  Wash with  H2O2.  Told her if condition worsens she needs to follow up with medical doctor. Return Visit-Office Procedure: Patient instructed to return to the office for a follow up visit 4 months for continued evaluation and treatment.    Helane Gunther DPM

## 2020-04-24 ENCOUNTER — Other Ambulatory Visit: Payer: Self-pay

## 2020-04-24 ENCOUNTER — Emergency Department: Payer: Medicare Other

## 2020-04-24 ENCOUNTER — Inpatient Hospital Stay
Admission: EM | Admit: 2020-04-24 | Discharge: 2020-04-28 | DRG: 690 | Disposition: A | Discharge status: Skilled Nursing Facility | Payer: Medicare Other | Attending: Internal Medicine | Admitting: Internal Medicine

## 2020-04-24 ENCOUNTER — Encounter: Payer: Self-pay | Admitting: Emergency Medicine

## 2020-04-24 DIAGNOSIS — B952 Enterococcus as the cause of diseases classified elsewhere: Secondary | ICD-10-CM | POA: Diagnosis present

## 2020-04-24 DIAGNOSIS — F0150 Vascular dementia without behavioral disturbance: Secondary | ICD-10-CM | POA: Diagnosis present

## 2020-04-24 DIAGNOSIS — Z9181 History of falling: Secondary | ICD-10-CM

## 2020-04-24 DIAGNOSIS — F329 Major depressive disorder, single episode, unspecified: Secondary | ICD-10-CM | POA: Diagnosis present

## 2020-04-24 DIAGNOSIS — Z87440 Personal history of urinary (tract) infections: Secondary | ICD-10-CM | POA: Diagnosis not present

## 2020-04-24 DIAGNOSIS — I10 Essential (primary) hypertension: Secondary | ICD-10-CM | POA: Diagnosis present

## 2020-04-24 DIAGNOSIS — E785 Hyperlipidemia, unspecified: Secondary | ICD-10-CM | POA: Diagnosis present

## 2020-04-24 DIAGNOSIS — M069 Rheumatoid arthritis, unspecified: Secondary | ICD-10-CM | POA: Diagnosis present

## 2020-04-24 DIAGNOSIS — N3000 Acute cystitis without hematuria: Principal | ICD-10-CM | POA: Diagnosis present

## 2020-04-24 DIAGNOSIS — Z20822 Contact with and (suspected) exposure to covid-19: Secondary | ICD-10-CM | POA: Diagnosis present

## 2020-04-24 DIAGNOSIS — F419 Anxiety disorder, unspecified: Secondary | ICD-10-CM | POA: Diagnosis present

## 2020-04-24 DIAGNOSIS — R4182 Altered mental status, unspecified: Secondary | ICD-10-CM | POA: Diagnosis present

## 2020-04-24 DIAGNOSIS — Z7952 Long term (current) use of systemic steroids: Secondary | ICD-10-CM

## 2020-04-24 DIAGNOSIS — Z79899 Other long term (current) drug therapy: Secondary | ICD-10-CM

## 2020-04-24 DIAGNOSIS — R296 Repeated falls: Secondary | ICD-10-CM | POA: Diagnosis not present

## 2020-04-24 DIAGNOSIS — G309 Alzheimer's disease, unspecified: Secondary | ICD-10-CM | POA: Diagnosis present

## 2020-04-24 DIAGNOSIS — W19XXXA Unspecified fall, initial encounter: Secondary | ICD-10-CM | POA: Diagnosis present

## 2020-04-24 DIAGNOSIS — Z66 Do not resuscitate: Secondary | ICD-10-CM | POA: Diagnosis present

## 2020-04-24 DIAGNOSIS — S80812A Abrasion, left lower leg, initial encounter: Secondary | ICD-10-CM | POA: Diagnosis present

## 2020-04-24 DIAGNOSIS — I69319 Unspecified symptoms and signs involving cognitive functions following cerebral infarction: Secondary | ICD-10-CM

## 2020-04-24 DIAGNOSIS — F0280 Dementia in other diseases classified elsewhere without behavioral disturbance: Secondary | ICD-10-CM | POA: Diagnosis present

## 2020-04-24 DIAGNOSIS — N390 Urinary tract infection, site not specified: Secondary | ICD-10-CM | POA: Diagnosis present

## 2020-04-24 DIAGNOSIS — R001 Bradycardia, unspecified: Secondary | ICD-10-CM | POA: Diagnosis not present

## 2020-04-24 DIAGNOSIS — Z7982 Long term (current) use of aspirin: Secondary | ICD-10-CM

## 2020-04-24 LAB — URINALYSIS, COMPLETE (UACMP) WITH MICROSCOPIC
Bilirubin Urine: NEGATIVE
Glucose, UA: NEGATIVE mg/dL
Hgb urine dipstick: NEGATIVE
Ketones, ur: NEGATIVE mg/dL
Leukocytes,Ua: NEGATIVE
Nitrite: NEGATIVE
Protein, ur: NEGATIVE mg/dL
Specific Gravity, Urine: 1.02 (ref 1.005–1.030)
pH: 5 (ref 5.0–8.0)

## 2020-04-24 LAB — COMPREHENSIVE METABOLIC PANEL
ALT: 17 U/L (ref 0–44)
AST: 25 U/L (ref 15–41)
Albumin: 3.9 g/dL (ref 3.5–5.0)
Alkaline Phosphatase: 104 U/L (ref 38–126)
Anion gap: 6 (ref 5–15)
BUN: 24 mg/dL — ABNORMAL HIGH (ref 8–23)
CO2: 24 mmol/L (ref 22–32)
Calcium: 8.6 mg/dL — ABNORMAL LOW (ref 8.9–10.3)
Chloride: 106 mmol/L (ref 98–111)
Creatinine, Ser: 0.78 mg/dL (ref 0.44–1.00)
GFR calc Af Amer: >60 mL/min (ref >60)
GFR calc non Af Amer: >60 mL/min (ref >60)
Glucose, Bld: 98 mg/dL (ref 70–99)
Potassium: 3.5 mmol/L (ref 3.5–5.1)
Sodium: 136 mmol/L (ref 135–145)
Total Bilirubin: 0.8 mg/dL (ref 0.3–1.2)
Total Protein: 6.9 g/dL (ref 6.5–8.1)

## 2020-04-24 LAB — CBC
HCT: 34.3 % — ABNORMAL LOW (ref 36.0–46.0)
Hemoglobin: 11.4 g/dL — ABNORMAL LOW (ref 12.0–15.0)
MCH: 29.6 pg (ref 26.0–34.0)
MCHC: 33.2 g/dL (ref 30.0–36.0)
MCV: 89.1 fL (ref 80.0–100.0)
Platelets: 267 10*3/uL (ref 150–400)
RBC: 3.85 MIL/uL — ABNORMAL LOW (ref 3.87–5.11)
RDW: 14.8 % (ref 11.5–15.5)
WBC: 6.4 10*3/uL (ref 4.0–10.5)
nRBC: 0 % (ref 0.0–0.2)

## 2020-04-24 LAB — SARS CORONAVIRUS 2 (TAT 6-24 HRS): SARS Coronavirus 2: NEGATIVE

## 2020-04-24 LAB — CK: Total CK: 320 U/L — ABNORMAL HIGH (ref 38–234)

## 2020-04-24 LAB — TSH: TSH: 6.023 u[IU]/mL — ABNORMAL HIGH (ref 0.350–4.500)

## 2020-04-24 LAB — MAGNESIUM: Magnesium: 2 mg/dL (ref 1.7–2.4)

## 2020-04-24 MED ORDER — PRAVASTATIN SODIUM 20 MG PO TABS
20.0000 mg | ORAL_TABLET | Freq: Every evening | ORAL | Status: DC
  Administered 2020-04-24 – 2020-04-27 (×4): 20 mg via ORAL
  Filled 2020-04-24 (×4): qty 1

## 2020-04-24 MED ORDER — ENOXAPARIN SODIUM 40 MG/0.4ML ~~LOC~~ SOLN
40.0000 mg | SUBCUTANEOUS | Status: DC
  Administered 2020-04-24 – 2020-04-27 (×4): 40 mg via SUBCUTANEOUS
  Filled 2020-04-24 (×4): qty 0.4

## 2020-04-24 MED ORDER — SODIUM CHLORIDE 0.9 % IV BOLUS
250.0000 mL | Freq: Once | INTRAVENOUS | Status: AC
  Administered 2020-04-24: 11:00:00 250 mL via INTRAVENOUS

## 2020-04-24 MED ORDER — METHOTREXATE 2.5 MG PO TABS
2.5000 mg | ORAL_TABLET | ORAL | Status: DC
  Filled 2020-04-24: qty 1

## 2020-04-24 MED ORDER — SERTRALINE HCL 50 MG PO TABS
50.0000 mg | ORAL_TABLET | Freq: Every day | ORAL | Status: DC
  Administered 2020-04-25 – 2020-04-28 (×4): 50 mg via ORAL
  Filled 2020-04-24 (×4): qty 1

## 2020-04-24 MED ORDER — SODIUM CHLORIDE 0.9 % IV SOLN: INTRAVENOUS | Status: DC

## 2020-04-24 MED ORDER — SODIUM CHLORIDE 0.9 % IV SOLN
1.0000 g | INTRAVENOUS | Status: DC
  Administered 2020-04-24 – 2020-04-25 (×2): 1 g via INTRAVENOUS
  Filled 2020-04-24 (×2): qty 1
  Filled 2020-04-24: qty 10

## 2020-04-24 MED ORDER — ACETAMINOPHEN 325 MG PO TABS
650.0000 mg | ORAL_TABLET | Freq: Four times a day (QID) | ORAL | Status: DC | PRN
  Filled 2020-04-24: qty 2

## 2020-04-24 MED ORDER — FOLIC ACID 1 MG PO TABS
2.0000 mg | ORAL_TABLET | Freq: Every day | ORAL | Status: DC
  Administered 2020-04-25 – 2020-04-28 (×4): 2 mg via ORAL
  Filled 2020-04-24 (×4): qty 2

## 2020-04-24 MED ORDER — ACETAMINOPHEN 650 MG RE SUPP: 650.0000 mg | Freq: Four times a day (QID) | RECTAL | Status: DC | PRN

## 2020-04-24 MED ORDER — PANTOPRAZOLE SODIUM 40 MG PO TBEC
40.0000 mg | DELAYED_RELEASE_TABLET | Freq: Every day | ORAL | Status: DC
  Administered 2020-04-25 – 2020-04-28 (×4): 40 mg via ORAL
  Filled 2020-04-24 (×4): qty 1

## 2020-04-24 MED ORDER — ASPIRIN EC 81 MG PO TBEC
81.0000 mg | DELAYED_RELEASE_TABLET | Freq: Every day | ORAL | Status: DC
  Administered 2020-04-25 – 2020-04-28 (×4): 81 mg via ORAL
  Filled 2020-04-24 (×4): qty 1

## 2020-04-24 MED ORDER — SODIUM CHLORIDE 0.9 % IV SOLN
1.0000 g | Freq: Once | INTRAVENOUS | Status: AC
  Administered 2020-04-24: 13:00:00 1 g via INTRAVENOUS
  Filled 2020-04-24: qty 10

## 2020-04-24 MED ORDER — ONDANSETRON HCL 4 MG/2ML IJ SOLN: 4.0000 mg | Freq: Four times a day (QID) | INTRAMUSCULAR | Status: DC | PRN

## 2020-04-24 MED ORDER — PREDNISONE 1 MG PO TABS
2.0000 mg | ORAL_TABLET | Freq: Every day | ORAL | Status: DC
  Administered 2020-04-25 – 2020-04-28 (×4): 2 mg via ORAL
  Filled 2020-04-24 (×5): qty 2

## 2020-04-24 MED ORDER — ONDANSETRON HCL 4 MG PO TABS: 4.0000 mg | ORAL_TABLET | Freq: Four times a day (QID) | ORAL | Status: DC | PRN

## 2020-04-24 MED ORDER — METHOTREXATE 2.5 MG PO TABS: 2.5000 mg | ORAL_TABLET | ORAL | Status: DC

## 2020-04-24 NOTE — ED Triage Notes (Signed)
Pt via EMS from home. Pt's daughter found her on the floor this AM around 0945. Unknown how long pt been on the floor. Per EMS, daughter states that she is not acting normally and last time this happened pt was dx with a UTI. Pt alert but disoriented to person, place, time, and situation. This the 3rd fall for this pt in the last three days. Pt has a hx of dementia. CBG 131.

## 2020-04-24 NOTE — ED Notes (Signed)
Pt unable to tell this RN if she is in pain.

## 2020-04-24 NOTE — ED Notes (Signed)
This RN to bedside for hourly rounding. Pt removed monitor, BP cuff, and pulse ox. Pt also removed IV and purewick at this time. This RN started new IV and wrapped it in Keflex. Did not replace purewick at this time.

## 2020-04-24 NOTE — ED Notes (Signed)
Pt transport to CT at this time.  

## 2020-04-24 NOTE — H&P (Signed)
History and Physical    Cheryl Arnold HYW:[id number] DOB: 19-May-1946 DOA: 04/24/2020  PCP: Kandyce Rud, MD   Patient coming from: Home  I have personally briefly reviewed patient's old medical records in [facility]  Chief Complaint: Frequent falls  HPI: Cheryl Arnold is a 74 y.o. female with medical history significant for vascular dementia, history of CVA, history of rheumatoid arthritis, hypertension, anxiety and depression who was brought into the emergency room for evaluation of frequent falls.  Per patient's stepdaughter she has had about 5 falls in the last couple of days.  Patient was recently started on a mood stabilizer, divalproex about 2 weeks ago and her family states that she started having falls not too long after initiation of the medication.  They also state that she usually has falls when she has a urinary tract infection. Patient had a CT scan of the head done without contrast which showed small-vessel white matter disease and nonacute encephalomalacia of the left frontal lobe in keeping with prior infarction. I am unable to do a review of systems due to patient's underlying dementia.  ED Course: Patient seen in the emergency room for evaluation of frequent falls which family noted following initiation of divalproex about a week ago.  Patient has had 4 doses since it was prescribed.  Family was also concerned about possible UTI which she said makes her weak and causes falls as well.  Patient has worsening dementia and family is unable to care for her and with her frequent falls she is a high risk for developing a fracture.  She will be admitted to the hospital for further evaluation  Review of Systems: As per HPI otherwise 10 point review of systems negative.    Past Medical History:  Diagnosis Date  . Anxiety and depression   . CVA, old, cognitive deficits   . Hyperlipidemia   . Hypertension   . Mixed Alzheimer's and vascular dementia (HCC)   . RA  (rheumatoid arthritis) (HCC)     History reviewed. No pertinent surgical history.   has an unknown smoking status. She has never used smokeless tobacco. No history on file for alcohol and drug.  No Active Allergies  Family History  Problem Relation Age of Onset  . Breast cancer Maternal Aunt      Prior to Admission medications   Medication Sig Start Date End Date Taking? Authorizing Provider  aspirin EC 81 MG tablet Take 81 mg by mouth daily.    [provider]  donepezil (ARICEPT) 10 MG tablet Take 10 mg by mouth Nightly. 09/09/18   [provider]  folic acid (FOLVITE) 1 MG tablet Take 2 mg by mouth daily. 05/22/18   [provider]  methotrexate (RHEUMATREX) 2.5 MG tablet TAKE 4 TABLETS EVERY 7 DAYS 07/17/19   [provider]  omeprazole (PRILOSEC) 20 MG capsule Take 20 mg by mouth daily. 09/09/18   [provider]  pravastatin (PRAVACHOL) 20 MG tablet Take 20 mg by mouth Nightly. 03/12/18   [provider]  predniSONE (DELTASONE) 1 MG tablet Take 2 mg by mouth daily. 06/12/19   [provider]  sertraline (ZOLOFT) 50 MG tablet Take 50 mg by mouth daily.    [provider]    Physical Exam: Vitals:   04/24/20 1015 04/24/20 1017 04/24/20 1122 04/24/20 1257  BP: (!) 151/93 (!) 151/93 (!) 143/91 (!) 168/85  Pulse:   (!) 52 (!) 53  Resp: 17  13 17   Temp:  97.8 F (36.6 C)    TempSrc:  Oral    SpO2:   99% 100%  Weight:      Height:         Vitals:   04/24/20 1015 04/24/20 1017 04/24/20 1122 04/24/20 1257  BP: (!) 151/93 (!) 151/93 (!) 143/91 (!) 168/85  Pulse:   (!) 52 (!) 53  Resp: 17  13 17   Temp:  97.8 F (36.6 C)    TempSrc:  Oral    SpO2:   99% 100%  Weight:      Height:        Constitutional: NAD, alert and oriented only to person Eyes: PERRL, lids and conjunctivae normal ENMT: Mucous membranes are moist.  Neck: normal, supple, no masses, no thyromegaly Respiratory: clear to auscultation  bilaterally, no wheezing, no crackles. Normal respiratory effort. No accessory muscle use.  Cardiovascular: Regular rate and rhythm, no murmurs / rubs / gallops. No extremity edema. 2+ pedal pulses. No carotid bruits.  Abdomen: no tenderness, no masses palpated. No hepatosplenomegaly. Bowel sounds positive.  Musculoskeletal: no clubbing / cyanosis. No joint deformity upper and lower extremities.  Skin: no rashes, lesions, ulcers.  Neurologic: No gross focal neurologic deficit. Psychiatric: Flat affect   Labs on Admission: I have personally reviewed following labs and imaging studies  CBC: Recent Labs  Lab 04/24/20 1019  WBC 6.4  HGB 11.4*  HCT 34.3*  MCV 89.1  PLT 267   Basic Metabolic Panel: Recent Labs  Lab 04/24/20 1019  NA 136  K 3.5  CL 106  CO2 24  GLUCOSE 98  BUN 24*  CREATININE 0.78  CALCIUM 8.6*  MG 2.0   GFR: Estimated Creatinine Clearance: 51 mL/min (by C-G formula based on SCr of 0.78 mg/dL). Liver Function Tests: Recent Labs  Lab 04/24/20 1019  AST 25  ALT 17  ALKPHOS 104  BILITOT 0.8  PROT 6.9  ALBUMIN 3.9   No results for input(s): LIPASE, AMYLASE in the last 168 hours. No results for input(s): AMMONIA in the last 168 hours. Coagulation Profile: No results for input(s): INR, PROTIME in the last 168 hours. Cardiac Enzymes: Recent Labs  Lab 04/24/20 1019  CKTOTAL 320*   BNP (last 3 results) No results for input(s): PROBNP in the last 8760 hours. HbA1C: No results for input(s): HGBA1C in the last 72 hours. CBG: No results for input(s): GLUCAP in the last 168 hours. Lipid Profile: No results for input(s): CHOL, HDL, LDLCALC, TRIG, CHOLHDL, LDLDIRECT in the last 72 hours. Thyroid Function Tests: No results for input(s): TSH, T4TOTAL, FREET4, T3FREE, THYROIDAB in the last 72 hours. Anemia Panel: No results for input(s): VITAMINB12, FOLATE, FERRITIN, TIBC, IRON, RETICCTPCT in the last 72 hours. Urine analysis:    Component Value  Date/Time   COLORURINE YELLOW (A) 04/24/2020 1122   APPEARANCEUR CLEAR (A) 04/24/2020 1122   LABSPEC 1.020 04/24/2020 1122   PHURINE 5.0 04/24/2020 1122   GLUCOSEU NEGATIVE 04/24/2020 1122   HGBUR NEGATIVE 04/24/2020 1122   BILIRUBINUR NEGATIVE 04/24/2020 1122   KETONESUR NEGATIVE 04/24/2020 1122   PROTEINUR NEGATIVE 04/24/2020 1122   NITRITE NEGATIVE 04/24/2020 1122   LEUKOCYTESUR NEGATIVE 04/24/2020 1122    Radiological Exams on Admission: CT Head Wo Contrast  Result Date: 04/24/2020 CLINICAL DATA:  Fall, head trauma, found down EXAM: CT HEAD WITHOUT CONTRAST CT CERVICAL SPINE WITHOUT CONTRAST TECHNIQUE: Multidetector CT imaging of the head and cervical spine was performed following the standard protocol without intravenous contrast. Multiplanar CT image reconstructions of  the cervical spine were also generated. COMPARISON:  None. FINDINGS: CT HEAD FINDINGS Brain: No evidence of acute infarction, hemorrhage, hydrocephalus, extra-axial collection or mass lesion/mass effect. Periventricular and deep white matter hypodensity with areas of encephalomalacia of the left frontal lobe. Vascular: No hyperdense vessel or unexpected calcification. Skull: Normal. Negative for fracture or focal lesion. Sinuses/Orbits: No acute finding. Other: None. CT CERVICAL SPINE FINDINGS Alignment: Degenerative straightening of the normal cervical lordosis. Skull base and vertebrae: No acute fracture. No primary bone lesion or focal pathologic process. Soft tissues and spinal canal: No prevertebral fluid or swelling. No visible canal hematoma. Disc levels: Moderate multilevel disc space height loss and osteophytosis of the lower cervical spine. Upper chest: Negative. Other: None. IMPRESSION: 1.  No acute intracranial pathology. 2. Small-vessel white matter disease and nonacute encephalomalacia of the left frontal lobe in keeping with prior infarction. 3.  No fracture or static subluxation of the cervical spine. 4.  Moderate  multilevel disc degenerative disease. Electronically Signed   By: Lauralyn Primes M.D.   On: 04/24/2020 11:58   CT Cervical Spine Wo Contrast  Result Date: 04/24/2020 CLINICAL DATA:  Fall, head trauma, found down EXAM: CT HEAD WITHOUT CONTRAST CT CERVICAL SPINE WITHOUT CONTRAST TECHNIQUE: Multidetector CT imaging of the head and cervical spine was performed following the standard protocol without intravenous contrast. Multiplanar CT image reconstructions of the cervical spine were also generated. COMPARISON:  None. FINDINGS: CT HEAD FINDINGS Brain: No evidence of acute infarction, hemorrhage, hydrocephalus, extra-axial collection or mass lesion/mass effect. Periventricular and deep white matter hypodensity with areas of encephalomalacia of the left frontal lobe. Vascular: No hyperdense vessel or unexpected calcification. Skull: Normal. Negative for fracture or focal lesion. Sinuses/Orbits: No acute finding. Other: None. CT CERVICAL SPINE FINDINGS Alignment: Degenerative straightening of the normal cervical lordosis. Skull base and vertebrae: No acute fracture. No primary bone lesion or focal pathologic process. Soft tissues and spinal canal: No prevertebral fluid or swelling. No visible canal hematoma. Disc levels: Moderate multilevel disc space height loss and osteophytosis of the lower cervical spine. Upper chest: Negative. Other: None. IMPRESSION: 1.  No acute intracranial pathology. 2. Small-vessel white matter disease and nonacute encephalomalacia of the left frontal lobe in keeping with prior infarction. 3.  No fracture or static subluxation of the cervical spine. 4.  Moderate multilevel disc degenerative disease. Electronically Signed   By: Lauralyn Primes M.D.   On: 04/24/2020 11:58    EKG: Independently reviewed.  Sinus Bradycardia  Assessment/Plan Principal Problem:   Acute lower UTI Active Problems:   Vascular dementia (HCC)   Frequent falls   RA (rheumatoid arthritis) (HCC)   Hypertension     Acute UTI Prior urine culture reveals Klebsiella pneumonia Patient has pyuria We will treat patient empirically with Rocephin 1 g IV daily until urine culture results become available.   Frequent falls Unclear etiology We will place patient on fall precautions Consult physical therapy   Vascular dementia Patient was recently started on Depakote and family states she started falling  not too long after We will hold Depakote for now Patient noted to be bradycardic on the monitor and is also on Aricept Hold Aricept for now Cardiac monitoring over the next 24 - 48 hours Orthostatic blood pressure checks Continue Sertraline Family states they are unable to continue to provide the care the patient needs at home due to worsening dementia and are seeking possible long-term placement in a skilled nursing facility  Rheumatoid arthritis Continue prednisone and methotrexate  DVT prophylaxis: Lovenox Code Status: DO NOT RESUSCITATE Family Communication: Greater than 50% of time was spent discussing patient's condition and plan of care with her stepdaughter and healthcare power of attorney, Sheliah Fiorillo over the phone.  All questions and concerns were addressed.  CODE STATUS was discussed and patient is a DO NOT RESUSCITATE Disposition Plan: Back to previous home environment Consults called: Physical therapy    Shalaina Guardiola MD [facility]     04/24/2020, 1:48 PM

## 2020-04-24 NOTE — ED Provider Notes (Signed)
[facility] Emergency Department Provider Note   ____________________________________________   First MD Initiated Contact with Patient 04/24/20 1039     (approximate)  I have reviewed the triage vital signs and the nursing notes.   HISTORY  Chief Complaint Fall and Altered Mental Status  EM caveat patient unable to provide history due to severe dementia, I did discuss with her POA and stepdaughter  HPI Cheryl Arnold is a 74 y.o. female here for evaluation for falls   She had several falls over the last several days.  The symptoms started around the time she started taking new medication per her stepdaughter.  Divalproex.  She has not had any obvious injuries except yesterday she did skin her left knee a little bit.  She has had falls like this before and they have come about due to urinary tract infections  She continues to be confused but that is her normal she normally does not recognize any where she is much she is in her home.  She is not had a recent illness though that is known such as fevers they have not noticed any other signs or symptoms except frequently falling  Past Medical History:  Diagnosis Date  . Anxiety and depression   . CVA, old, cognitive deficits   . Hyperlipidemia   . Hypertension   . Mixed Alzheimer's and vascular dementia ([place])   . RA (rheumatoid arthritis) [facility])     Patient Active Problem List   Diagnosis Date Noted  . Pain due to onychomycosis of toenails of both feet 07/28/2019  . Pressure injury of skin 06/29/2019  . Altered mental status 06/28/2019    History reviewed. No pertinent surgical history.  Prior to Admission medications   Medication Sig Start Date End Date Taking? Authorizing Provider  aspirin EC 81 MG tablet Take 81 mg by mouth daily.    [provider]  donepezil (ARICEPT) 10 MG tablet Take 10 mg by mouth Nightly. 09/09/18   [provider]  folic acid (FOLVITE) 1 MG tablet Take  2 mg by mouth daily. 05/22/18   [provider]  methotrexate (RHEUMATREX) 2.5 MG tablet TAKE 4 TABLETS EVERY 7 DAYS 07/17/19   [provider]  omeprazole (PRILOSEC) 20 MG capsule Take 20 mg by mouth daily. 09/09/18   [provider]  pravastatin (PRAVACHOL) 20 MG tablet Take 20 mg by mouth Nightly. 03/12/18   [provider]  predniSONE (DELTASONE) 1 MG tablet Take 2 mg by mouth daily. 06/12/19   [provider]  sertraline (ZOLOFT) 50 MG tablet Take 50 mg by mouth daily.    [provider]    Allergies Patient has no active allergies.  Family History  Problem Relation Age of Onset  . Breast cancer Maternal Aunt     Social History Social History   Tobacco Use  . Smoking status: Unknown If Ever Smoked  . Smokeless tobacco: Never Used  Substance Use Topics  . Alcohol use: Not on file  . Drug use: Not on file    Review of Systems  EM caveat   ____________________________________________   PHYSICAL EXAM:  VITAL SIGNS: ED Triage Vitals  Enc Vitals Group     BP 04/24/20 1017 (!) 151/93     Pulse Rate 04/24/20 1014 (!) 53     Resp 04/24/20 1014 17     Temp 04/24/20 1017 97.8 F (36.6 C)     Temp Source 04/24/20 1017 Oral  SpO2 04/24/20 1014 94 %     Weight 04/24/20 1014 115 lb (52.2 kg)     Height 04/24/20 1014 5\' 2"  (1.575 m)     Head Circumference --      Peak Flow --      Pain Score --      Pain Loc --      Pain Edu? --      Excl. in GC? --     Constitutional: Alert and oriented to self but not year or place. Well appearing and in no acute distress.  Seems pleasantly confused Eyes: Conjunctivae are normal. Head: Atraumatic. Nose: No congestion/rhinnorhea. Mouth/Throat: Mucous membranes are moist. Neck: No stridor.  Cardiovascular: Normal rate, regular rhythm. Grossly normal heart sounds.  Good peripheral circulation. Respiratory: Normal respiratory effort.  No retractions. Lungs CTAB. Gastrointestinal:  Soft and nontender. No distention. Musculoskeletal: No lower extremity tenderness nor edema does have a couple very small skin tears to the anterior left shin that appears to be starting to heal. Neurologic:  Normal speech and language. No gross focal neurologic deficits are appreciated.  Skin:  Skin is warm, dry and intact. No rash noted. Psychiatric: Mood and affect are normal. Speech and behavior are normal.  ____________________________________________   LABS (all labs ordered are listed, but only abnormal results are displayed)  Labs Reviewed  COMPREHENSIVE METABOLIC PANEL - Abnormal; Notable for the following components:      Result Value   BUN 24 (*)    Calcium 8.6 (*)    All other components within normal limits  CBC - Abnormal; Notable for the following components:   RBC 3.85 (*)    Hemoglobin 11.4 (*)    HCT 34.3 (*)    All other components within normal limits  CK - Abnormal; Notable for the following components:   Total CK 320 (*)    All other components within normal limits  URINE CULTURE  URINALYSIS, COMPLETE (UACMP) WITH MICROSCOPIC   ____________________________________________  EKG  Reviewed and interpreted by me at 1020  heart rate 69 QRS 99 QTc just less than 600 Prolonged QT, nonspecific T wave abnormality.  Flattening.  No STEMI   ____________________________________________  RADIOLOGY   ____________________________________________   PROCEDURES  Procedure(s) performed: 3-lead  .1-3 Lead EKG Interpretation Performed by: , MD Authorized by: Sharyn Creamer, MD     Interpretation: abnormal     ECG rate:  Sinus bradycardia   ECG rate assessment: bradycardic     Rhythm: sinus bradycardia     Ectopy: none     Conduction: normal      Critical Care performed: No  ____________________________________________   INITIAL IMPRESSION / ASSESSMENT AND PLAN / ED COURSE  Pertinent labs & imaging results that were available during my care  of the patient were reviewed by me and considered in my medical decision making (see chart for details).   Patient in no acute distress, reassuring vital signs.  Resting pleasantly.  Clinical Course as of Apr 24 1112  Sat Apr 24, 2020  1057 Per step-daughter patient fell twice last night and several since Thursday. Patient has had many falls. Has history or prior UTIs also a stroke years ago. Dr. Saturday also Rxed on a new medicine and (? If this could have caused) divalproex (mood).    [MQ]  1100 Patient is only oriented in her own house per daughter, no surprise she doesn't recognize being at hospital. Has severe dementia.    [MQ]  1103 Daughter reports  she is also POA   [MQ]    Clinical Course User Index [MQ] Sharyn Creamer, MD   Work-up to this point, demonstrates many bacteria in the urine, will send for culture.  I will treat with Rocephin given her previous history of UTIs that have led to similar symptoms per discussion with the family.  Unclear as we await culture though if there is certainly a UTI, also suspect possibly related to new medication.  Admission discussed with Dr. Joylene Igo.  I fear that the patient was sent home at this time she would not have adequate support and would likely fall again given her history.  ____________________________________________   FINAL CLINICAL IMPRESSION(S) / ED DIAGNOSES  Final diagnoses:  Fall, initial encounter  Abrasion of left leg, initial encounter        Note:  This document was prepared using Dragon voice recognition software and may include unintentional dictation errors       Sharyn Creamer, MD 04/24/20 1329

## 2020-04-25 LAB — BASIC METABOLIC PANEL
Anion gap: 6 (ref 5–15)
BUN: 13 mg/dL (ref 8–23)
CO2: 24 mmol/L (ref 22–32)
Calcium: 8.1 mg/dL — ABNORMAL LOW (ref 8.9–10.3)
Chloride: 106 mmol/L (ref 98–111)
Creatinine, Ser: 0.6 mg/dL (ref 0.44–1.00)
GFR calc Af Amer: >60 mL/min (ref >60)
GFR calc non Af Amer: >60 mL/min (ref >60)
Glucose, Bld: 91 mg/dL (ref 70–99)
Potassium: 3.1 mmol/L — ABNORMAL LOW (ref 3.5–5.1)
Sodium: 136 mmol/L (ref 135–145)

## 2020-04-25 LAB — CBC
HCT: 35.8 % — ABNORMAL LOW (ref 36.0–46.0)
Hemoglobin: 12 g/dL (ref 12.0–15.0)
MCH: 29.7 pg (ref 26.0–34.0)
MCHC: 33.5 g/dL (ref 30.0–36.0)
MCV: 88.6 fL (ref 80.0–100.0)
Platelets: 237 10*3/uL (ref 150–400)
RBC: 4.04 MIL/uL (ref 3.87–5.11)
RDW: 14.8 % (ref 11.5–15.5)
WBC: 5.2 10*3/uL (ref 4.0–10.5)
nRBC: 0 % (ref 0.0–0.2)

## 2020-04-25 MED ORDER — POTASSIUM CHLORIDE CRYS ER 20 MEQ PO TBCR
40.0000 meq | EXTENDED_RELEASE_TABLET | Freq: Once | ORAL | Status: AC
  Administered 2020-04-25: 10:00:00 40 meq via ORAL
  Filled 2020-04-25: qty 2

## 2020-04-25 NOTE — TOC Initial Note (Signed)
Transition of Care [facility]) - Initial/Assessment Note    Patient Details  Name: Cheryl Arnold MRN: [id number] Date of Birth: 1946/10/30  Transition of Care [facility]) CM/SW Contact:    Amador Cunas, [place] Phone Number: 04/25/2020, 11:25 AM  Clinical Narrative:   Spoke with pt's dtr/POA, Amy [phone], re PT recommendation for SNF. Pt with no previous SNF stay per dtr. Admitted from home with her husband, dtr, and caregiver who assists 3 days per week for approx 4 hours each day. Reviewed SNF placement process and answered questions. Dtr expresses interest in eventual LTC after rehab. Will initiate SNF search and f/u with offers. Navi/UHC auth request faxed.   Wandra Feinstein, MSW, LCSW [phone] (coverage)           Expected Discharge Plan: [place] Barriers to Discharge: Continued Medical Work up, Ship broker   Patient Goals and CMS Choice   CMS Medicare.gov Compare Post Acute Care list provided to:: Patient Represenative (must comment)(dtr/POA)    Expected Discharge Plan and Services Expected Discharge Plan: [place]       Living arrangements for the past 2 months: Single Family Home                                      Prior Living Arrangements/Services Living arrangements for the past 2 months: Single Family Home Lives with:: Adult Children, Spouse Patient language and need for interpreter reviewed:: No        Need for Family Participation in Patient Care: Yes (Comment) Care giver support system in place?: Yes (comment) Current home services: DME, Sitter Criminal Activity/Legal Involvement Pertinent to Current Situation/Hospitalization: No - Comment as needed  Activities of Daily Living   ADL Screening (condition at time of admission) Patient's cognitive ability adequate to safely complete daily activities?: No Is the patient deaf or have difficulty hearing?: No Does the patient have difficulty seeing,  even when wearing glasses/contacts?: No Does the patient have difficulty concentrating, remembering, or making decisions?: Yes Patient able to express need for assistance with ADLs?: Yes Does the patient have difficulty dressing or bathing?: Yes Independently performs ADLs?: No Communication: Needs assistance Is this a change from baseline?: Pre-admission baseline In/Out Bed: Needs assistance Is this a change from baseline?: Pre-admission baseline Walks in Home: Independent with device (comment)(progressive weakness per dtr) Does the patient have difficulty walking or climbing stairs?: Yes Weakness of Legs: None Weakness of Arms/Hands: None  Permission Sought/Granted                  Emotional Assessment       Orientation: : Oriented to Self Alcohol / Substance Use: Not Applicable Psych Involvement: No (comment)  Admission diagnosis:  Frequent falls [R29.6] Abrasion of left leg, initial encounter [S80.812A] Fall, initial encounter [W19.XXXA] Patient Active Problem List   Diagnosis Date Noted  . Vascular dementia ([place]) 04/24/2020  . Acute lower UTI 04/24/2020  . Frequent falls 04/24/2020  . RA (rheumatoid arthritis) ([place])   . Hypertension   . Pain due to onychomycosis of toenails of both feet 07/28/2019  . Pressure injury of skin 06/29/2019  . Altered mental status 06/28/2019   PCP:  Derinda Late, MD Pharmacy:   [id number] [address], [place] [address] Phone: [phone] Fax: [phone]  [facility] - [place], [place] - [address] [place] [address] Phone: [phone] Fax: [phone]  [facility] [address], [place] - [address] [place] [address] Phone: [phone] Fax: [phone]     Social Determinants of Health (SDOH) Interventions    Readmission Risk  Interventions No flowsheet data found.

## 2020-04-25 NOTE — Evaluation (Signed)
Physical Therapy Evaluation Patient Details Name: Cheryl Arnold MRN: [id number] DOB: 1946-02-07 Today's Date: 04/25/2020   History of Present Illness  74 year old female from home with frequent falls, admitted with acute lower UTI. PMH includes: dementia, CVA, RA, HTN.  Clinical Impression  Patient received in bed, alert, unable to answer any questions. Does not follow direction for mobility tasks. She required max/total assist for bed mobility at this time due to confusion. We will continue to follow patient while here as appropriate to see if mentation improves and if we can assess mobility better. She may need higher level of care upon discharge.       Follow Up Recommendations SNF;Supervision/Assistance - 24 hour memory care unit.     Equipment Recommendations  None recommended by PT    Recommendations for Other Services       Precautions / Restrictions Precautions Precautions: Fall Restrictions Weight Bearing Restrictions: No      Mobility  Bed Mobility Overal bed mobility: Needs Assistance Bed Mobility: Rolling;Supine to Sit Rolling: Total assist   Supine to sit: Total assist     General bed mobility comments: requires total assist at this time as she is unable to follow any direction for mobility.  Transfers                 General transfer comment: not performed  Ambulation/Gait             General Gait Details: not performed  Stairs            Wheelchair Mobility    Modified Rankin (Stroke Patients Only)       Balance Overall balance assessment: Needs assistance   Sitting balance-Leahy Scale: Poor                                       Pertinent Vitals/Pain Pain Assessment: No/denies pain    Home Living                   Additional Comments: Pt unable to answer any questions about living situation    Prior Function           Comments: Unsure, from chart all I gathered was that she has frequent  falls. Patient unable to answer questions.     Hand Dominance        Extremity/Trunk Assessment                Communication   Communication: Expressive difficulties  Cognition Arousal/Alertness: Awake/alert Behavior During Therapy: WFL for tasks assessed/performed Overall Cognitive Status: No family/caregiver present to determine baseline cognitive functioning                                 General Comments: patient alert, not oriented at all, does not answer questions appropriately.      General Comments      Exercises     Assessment/Plan    PT Assessment Patient needs continued PT services  PT Problem List Decreased strength;Decreased mobility;Decreased cognition;Decreased balance;Decreased safety awareness       PT Treatment Interventions Therapeutic activities;Gait training;Functional mobility training;Patient/family education    PT Goals (Current goals can be found in the Care Plan section)  Acute Rehab PT Goals Patient Stated Goal: none stated PT Goal Formulation: Patient unable to participate in goal setting Time  For Goal Achievement: 05/09/20 Potential to Achieve Goals: Fair    Frequency Min 2X/week   Barriers to discharge   per H&P she lives at home with family.    Co-evaluation               AM-PAC PT "6 Clicks" Mobility  Outcome Measure Help needed turning from your back to your side while in a flat bed without using bedrails?: Total Help needed moving from lying on your back to sitting on the side of a flat bed without using bedrails?: Total Help needed moving to and from a bed to a chair (including a wheelchair)?: Total Help needed standing up from a chair using your arms (e.g., wheelchair or bedside chair)?: Total Help needed to walk in hospital room?: Total Help needed climbing 3-5 steps with a railing? : Total 6 Click Score: 6    End of Session   Activity Tolerance: Other (comment)(limited by severe  dementia) Patient left: in bed;with bed alarm set Nurse Communication: Mobility status PT Visit Diagnosis: Repeated falls (R29.6);Muscle weakness (generalized) (M62.81);Unsteadiness on feet (R26.81);Other abnormalities of gait and mobility (R26.89);Difficulty in walking, not elsewhere classified (R26.2)    Time: 1010-1025 PT Time Calculation (min) (ACUTE ONLY): 15 min   Charges:   PT Evaluation $PT Eval Moderate Complexity: 1 Mod PT Treatments $Therapeutic Activity: 8-22 mins        Nghia Mcentee, PT, GCS 04/25/20,10:40 AM

## 2020-04-25 NOTE — NC FL2 (Signed)
[place] LEVEL OF CARE SCREENING TOOL     IDENTIFICATION  Patient Name: Cheryl Arnold Birthdate: April 14, 1946 Sex: female Admission Date (Current Location): 04/24/2020  [facility] and [place] Number:  Engineering geologist and Address:  [facility], [address] [place], [place] [address]      Provider Number: [id number]  Attending Physician Name and Address:  Nolberto Hanlon, MD  Relative Name and Phone Number:       Current Level of Care: Hospital Recommended Level of Care: [place] Prior Approval Number:    Date Approved/Denied:   PASRR Number:    Discharge Plan: SNF    Current Diagnoses: Patient Active Problem List   Diagnosis Date Noted  . Vascular dementia ([place]) 04/24/2020  . Acute lower UTI 04/24/2020  . Frequent falls 04/24/2020  . RA (rheumatoid arthritis) ([place])   . Hypertension   . Pain due to onychomycosis of toenails of both feet 07/28/2019  . Pressure injury of skin 06/29/2019  . Altered mental status 06/28/2019    Orientation RESPIRATION BLADDER Height & Weight     Self  Normal External catheter Weight: 115 lb (52.2 kg) Height:  5\' 2"  (157.5 cm)  BEHAVIORAL SYMPTOMS/MOOD NEUROLOGICAL BOWEL NUTRITION STATUS      Continent    AMBULATORY STATUS COMMUNICATION OF NEEDS Skin   Extensive Assist Verbally Normal                       Personal Care Assistance Level of Assistance  Bathing, Dressing, Feeding Bathing Assistance: Maximum assistance Feeding assistance: Limited assistance Dressing Assistance: Maximum assistance     Functional Limitations Info  Sight, Hearing Sight Info: Impaired Hearing Info: Impaired      SPECIAL CARE FACTORS FREQUENCY  PT (By licensed PT), OT (By licensed OT)                    Contractures Contractures Info: Not present    Additional Factors Info  Code Status Code Status Info: DNR             Current Medications (04/25/2020):  This is the  current hospital active medication list Current Facility-Administered Medications  Medication Dose Route Frequency Provider Last Rate Last Admin  . 0.9 %  sodium chloride infusion   Intravenous Continuous Agbata, Tochukwu, MD 50 mL/hr at 04/25/20 0200 Rate Verify at 04/25/20 0200  . acetaminophen (TYLENOL) tablet 650 mg  650 mg Oral Q6H PRN Agbata, Tochukwu, MD       Or  . acetaminophen (TYLENOL) suppository 650 mg  650 mg Rectal Q6H PRN Agbata, Tochukwu, MD      . aspirin EC tablet 81 mg  81 mg Oral Daily Agbata, Tochukwu, MD   81 mg at 04/25/20 0934  . cefTRIAXone (ROCEPHIN) 1 g in sodium chloride 0.9 % 100 mL IVPB  1 g Intravenous Q24H Collier Bullock, MD   Stopped at 04/24/20 1747  . enoxaparin (LOVENOX) injection 40 mg  40 mg Subcutaneous Q24H Agbata, Tochukwu, MD   40 mg at 56/38/75 6433  . folic acid (FOLVITE) tablet 2 mg  2 mg Oral Daily Agbata, Tochukwu, MD   2 mg at 04/25/20 0933  . [START ON 04/26/2020] methotrexate (RHEUMATREX) tablet 2.5 mg  2.5 mg Oral Weekly Duncan, Asajah R, RPH      . ondansetron (ZOFRAN) tablet 4 mg  4 mg Oral Q6H PRN Agbata, Tochukwu, MD       Or  .  ondansetron (ZOFRAN) injection 4 mg  4 mg Intravenous Q6H PRN Agbata, Tochukwu, MD      . pantoprazole (PROTONIX) EC tablet 40 mg  40 mg Oral Daily Agbata, Tochukwu, MD   40 mg at 04/25/20 0934  . pravastatin (PRAVACHOL) tablet 20 mg  20 mg Oral Nightly Agbata, Tochukwu, MD   20 mg at 04/24/20 2035  . predniSONE (DELTASONE) tablet 2 mg  2 mg Oral Daily Agbata, Tochukwu, MD   2 mg at 04/25/20 0934  . sertraline (ZOLOFT) tablet 50 mg  50 mg Oral Daily Agbata, Tochukwu, MD   50 mg at 04/25/20 6433     Discharge Medications: Please see discharge summary for a list of discharge medications.  Relevant Imaging Results:  Relevant Lab Results:   Additional Information SS# [id number]  Deatra Robinson, [place]

## 2020-04-25 NOTE — Progress Notes (Signed)
PROGRESS NOTE    Cheryl Arnold  HOZ:[id number] DOB: March 29, 1946 DOA: 04/24/2020 PCP: Kandyce Rud, MD    Brief Narrative:  Cheryl Arnold is a 74 y.o. female with medical history significant for vascular dementia, history of CVA, history of rheumatoid arthritis, hypertension, anxiety and depression who was brought into the emergency room for evaluation of frequent falls.    Consultants:     Procedures:   Antimicrobials:   -Rocephin   Subjective: Patient confused, she is awake and alert however unable to tell me where she is, not oriented to person, or date. Tries to be communicative but appears to be at baseline confusion  Objective: Vitals:   04/24/20 2031 04/25/20 0013 04/25/20 0110 04/25/20 0527  BP: (!) 168/84 (!) 154/96  (!) 148/95  Pulse: (!) 58 68  (!) 57  Resp: 20 20  20   Temp: 98.7 F (37.1 C) 98.4 F (36.9 C)  97.7 F (36.5 C)  TempSrc: Oral Oral  Oral  SpO2: 90% 90% 92% 100%  Weight:      Height:        Intake/Output Summary (Last 24 hours) at 04/25/2020 1351 Last data filed at 04/25/2020 0644 Gross per 24 hour  Intake 612.36 ml  Output 850 ml  Net -237.64 ml   Filed Weights   04/24/20 1014  Weight: 52.2 kg    Examination:  General exam: Appears calm and comfortable , confused Respiratory system: Clear to auscultation. Respiratory effort normal. Cardiovascular system: S1 & S2 heard, RRR. No JVD, murmurs, rubs, gallops or clicks.  Gastrointestinal system: Abdomen is nondistended, soft and nontender.  Normal bowel sounds heard. Central nervous system:grossly intact, confused appears to be baseine Extremities: no edema Skin: Warm dry Psychiatry: Judgement and insight appear normal. Mood & affect appropriate.     Data Reviewed: I have personally reviewed following labs and imaging studies  CBC: Recent Labs  Lab 04/24/20 1019 04/25/20 0549  WBC 6.4 5.2  HGB 11.4* 12.0  HCT 34.3* 35.8*  MCV 89.1 88.6  PLT 267 237   Basic Metabolic  Panel: Recent Labs  Lab 04/24/20 1019 04/25/20 0549  NA 136 136  K 3.5 3.1*  CL 106 106  CO2 24 24  GLUCOSE 98 91  BUN 24* 13  CREATININE 0.78 0.60  CALCIUM 8.6* 8.1*  MG 2.0  --    GFR: Estimated Creatinine Clearance: 51 mL/min (by C-G formula based on SCr of 0.6 mg/dL). Liver Function Tests: Recent Labs  Lab 04/24/20 1019  AST 25  ALT 17  ALKPHOS 104  BILITOT 0.8  PROT 6.9  ALBUMIN 3.9   No results for input(s): LIPASE, AMYLASE in the last 168 hours. No results for input(s): AMMONIA in the last 168 hours. Coagulation Profile: No results for input(s): INR, PROTIME in the last 168 hours. Cardiac Enzymes: Recent Labs  Lab 04/24/20 1019  CKTOTAL 320*   BNP (last 3 results) No results for input(s): PROBNP in the last 8760 hours. HbA1C: No results for input(s): HGBA1C in the last 72 hours. CBG: No results for input(s): GLUCAP in the last 168 hours. Lipid Profile: No results for input(s): CHOL, HDL, LDLCALC, TRIG, CHOLHDL, LDLDIRECT in the last 72 hours. Thyroid Function Tests: Recent Labs    04/24/20 1019  TSH 6.023*   Anemia Panel: No results for input(s): VITAMINB12, FOLATE, FERRITIN, TIBC, IRON, RETICCTPCT in the last 72 hours. Sepsis Labs: No results for input(s): PROCALCITON, LATICACIDVEN in the last 168 hours.  Recent Results (from the past  240 hour(s))  Urine Culture     Status: Abnormal (Preliminary result)   Collection Time: 04/24/20 11:22 AM   Specimen: Urine, Clean Catch  Result Value Ref Range Status   Specimen Description   Final    URINE, CLEAN CATCH Performed at [facility], [address] [place], [place] [address]    Special Requests   Final    Normal Performed at [facility], [address], [place], [place] [address]    Culture (A)  Final    >=100,000 COLONIES/mL ENTEROCOCCUS FAECALIS CULTURE REINCUBATED FOR BETTER GROWTH Performed at [facility], [place] [address], [place], [place] [address]     Report Status PENDING  Incomplete  SARS CORONAVIRUS 2 (TAT 6-24 HRS) Nasopharyngeal Nasopharyngeal Swab     Status: None   Collection Time: 04/24/20  3:39 PM   Specimen: Nasopharyngeal Swab  Result Value Ref Range Status   SARS Coronavirus 2 NEGATIVE NEGATIVE Final    Comment: (NOTE) SARS-CoV-2 target nucleic acids are NOT DETECTED. The SARS-CoV-2 RNA is generally detectable in upper and lower respiratory specimens during the acute phase of infection. Negative results do not preclude SARS-CoV-2 infection, do not rule out co-infections with other pathogens, and should not be used as the sole basis for treatment or other patient management decisions. Negative results must be combined with clinical observations, patient history, and epidemiological information. The expected result is Negative. Fact Sheet for Patients: SugarRoll.be Fact Sheet for Healthcare Providers: https://www.woods-mathews.com/ This test is not yet approved or cleared by the [place] FDA and  has been authorized for detection and/or diagnosis of SARS-CoV-2 by FDA under an Emergency Use Authorization (EUA). This EUA will remain  in effect (meaning this test can be used) for the duration of the COVID-19 declaration under Section 56 4(b)(1) of the Act, 21 U.S.C. section 360bbb-3(b)(1), unless the authorization is terminated or revoked sooner. Performed at [facility], [place] [address] [place], [place] [address]          Radiology Studies: CT Head Wo Contrast  Result Date: 04/24/2020 CLINICAL DATA:  Fall, head trauma, found down EXAM: CT HEAD WITHOUT CONTRAST CT CERVICAL SPINE WITHOUT CONTRAST TECHNIQUE: Multidetector CT imaging of the head and cervical spine was performed following the standard protocol without intravenous contrast. Multiplanar CT image reconstructions of the cervical spine were also generated. COMPARISON:  None. FINDINGS: CT HEAD FINDINGS Brain:  No evidence of acute infarction, hemorrhage, hydrocephalus, extra-axial collection or mass lesion/mass effect. Periventricular and deep white matter hypodensity with areas of encephalomalacia of the left frontal lobe. Vascular: No hyperdense vessel or unexpected calcification. Skull: Normal. Negative for fracture or focal lesion. Sinuses/Orbits: No acute finding. Other: None. CT CERVICAL SPINE FINDINGS Alignment: Degenerative straightening of the normal cervical lordosis. Skull base and vertebrae: No acute fracture. No primary bone lesion or focal pathologic process. Soft tissues and spinal canal: No prevertebral fluid or swelling. No visible canal hematoma. Disc levels: Moderate multilevel disc space height loss and osteophytosis of the lower cervical spine. Upper chest: Negative. Other: None. IMPRESSION: 1.  No acute intracranial pathology. 2. Small-vessel white matter disease and nonacute encephalomalacia of the left frontal lobe in keeping with prior infarction. 3.  No fracture or static subluxation of the cervical spine. 4.  Moderate multilevel disc degenerative disease. Electronically Signed   By: Eddie Candle M.D.   On: 04/24/2020 11:58   CT Cervical Spine Wo Contrast  Result Date: 04/24/2020 CLINICAL DATA:  Fall, head trauma, found  down EXAM: CT HEAD WITHOUT CONTRAST CT CERVICAL SPINE WITHOUT CONTRAST TECHNIQUE: Multidetector CT imaging of the head and cervical spine was performed following the standard protocol without intravenous contrast. Multiplanar CT image reconstructions of the cervical spine were also generated. COMPARISON:  None. FINDINGS: CT HEAD FINDINGS Brain: No evidence of acute infarction, hemorrhage, hydrocephalus, extra-axial collection or mass lesion/mass effect. Periventricular and deep white matter hypodensity with areas of encephalomalacia of the left frontal lobe. Vascular: No hyperdense vessel or unexpected calcification. Skull: Normal. Negative for fracture or focal lesion.  Sinuses/Orbits: No acute finding. Other: None. CT CERVICAL SPINE FINDINGS Alignment: Degenerative straightening of the normal cervical lordosis. Skull base and vertebrae: No acute fracture. No primary bone lesion or focal pathologic process. Soft tissues and spinal canal: No prevertebral fluid or swelling. No visible canal hematoma. Disc levels: Moderate multilevel disc space height loss and osteophytosis of the lower cervical spine. Upper chest: Negative. Other: None. IMPRESSION: 1.  No acute intracranial pathology. 2. Small-vessel white matter disease and nonacute encephalomalacia of the left frontal lobe in keeping with prior infarction. 3.  No fracture or static subluxation of the cervical spine. 4.  Moderate multilevel disc degenerative disease. Electronically Signed   By: Lauralyn Primes M.D.   On: 04/24/2020 11:58        Scheduled Meds: . aspirin EC  81 mg Oral Daily  . enoxaparin (LOVENOX) injection  40 mg Subcutaneous Q24H  . folic acid  2 mg Oral Daily  . [START ON 04/26/2020] methotrexate  2.5 mg Oral Weekly  . pantoprazole  40 mg Oral Daily  . pravastatin  20 mg Oral Nightly  . predniSONE  2 mg Oral Daily  . sertraline  50 mg Oral Daily   Continuous Infusions: . sodium chloride 50 mL/hr at 04/25/20 1141  . cefTRIAXone (ROCEPHIN)  IV 1 g (04/25/20 1346)    Assessment & Plan:   Principal Problem:   Acute lower UTI Active Problems:   Vascular dementia (HCC)   Frequent falls   RA (rheumatoid arthritis) (H[facility])   Hypertension   Acute UTI Prior urine culture reveals Klebsiella pneumonia Patient has pyuria We will treat patient empirically with Rocephin 1 g IV daily until urine culture results become available.   Frequent falls Unclear etiology Possibly due to depakote , as falls appears to have started after initiation of this medication. ]will hold Depakote fall precautions Consult PT/OT   Vascular dementia Patient noted to be bradycardic on the monitor and is also  on Aricept Hold Aricept for now Cardiac monitoring over the next 24 - 48 hours Orthostatic blood pressure checks Continue Sertraline Family states they are unable to continue to provide the care the patient needs at home due to worsening dementia and are seeking possible long-term placement in a skilled nursing facility  Rheumatoid arthritis Continue prednisone and methotrexate   DVT prophylaxis: Lovenox Code Status: DNR Family Communication: none at bedside Disposition Plan: Back to previous home environment vs snf. Family cannot take care of it. Barrier: Needs PT/OT , may need placement as family cannot take care of her         LOS: 1 day   Time spent: 45 minutes with more than 50% COC    Lynn Ito, MD [facility] Pager [phone]  If 7PM-7AM, please contact night-coverage [email or web address] Password [email or web address] 04/25/2020, 1:51 PM

## 2020-04-26 MED ORDER — METHOTREXATE 2.5 MG PO TABS
10.0000 mg | ORAL_TABLET | ORAL | Status: DC
  Administered 2020-04-26: 10 mg via ORAL
  Filled 2020-04-26: qty 4

## 2020-04-26 MED ORDER — AMPICILLIN 500 MG PO CAPS
500.0000 mg | ORAL_CAPSULE | Freq: Four times a day (QID) | ORAL | Status: DC
  Administered 2020-04-26 – 2020-04-28 (×9): 500 mg via ORAL
  Filled 2020-04-26 (×12): qty 1

## 2020-04-26 NOTE — Progress Notes (Signed)
Physical Therapy Treatment Patient Details Name: Cheryl Arnold MRN: [id number] DOB: 1946/07/10 Today's Date: 04/26/2020    History of Present Illness 74 year old female from home with frequent falls, admitted with acute lower UTI. PMH includes: dementia, CVA, RA, HTN.    PT Comments    Pt alert, oriented to name only. Able to follow commands intermittently, verbal/tactile cues and facilitation to accomplish functional mobility. The patient demonstrated several LE exercises AROM, and supine to sit with minA. Assistance provided to shift anteriorly towards EOB to place feet on the floor, fair balance noted. The patient attempted sit <> stand with modA, unable to come to fully stand, did grimace with pain. Assisted to supine and rolling performed with RN assist (modA for rolling) to clean up BM. Pt up inbed, all needs in reach at end of session in NAD. The patient would benefit from further skilled PT intervention to continue to progress towards goals. Recommendation remains appropriate.       Follow Up Recommendations  SNF;Supervision/Assistance - 24 hour     Equipment Recommendations  None recommended by PT    Recommendations for Other Services       Precautions / Restrictions Precautions Precautions: Fall Restrictions Weight Bearing Restrictions: No    Mobility  Bed Mobility Overal bed mobility: Needs Assistance Bed Mobility: Rolling;Supine to Sit;Sit to Supine Rolling: Mod assist;+2 for safety/equipment   Supine to sit: Min assist Sit to supine: Min assist   General bed mobility comments: minA, requires tactile cues. intermittently followed commands  Transfers Overall transfer level: Needs assistance Equipment used: 1 person hand held assist Transfers: Sit to/from Stand Sit to Stand: Mod assist         General transfer comment: unable to come to full standing, difficulty understanding commands. did grimace like she was feeling pain  Ambulation/Gait              General Gait Details: unable   Stairs             Wheelchair Mobility    Modified Rankin (Stroke Patients Only)       Balance Overall balance assessment: Needs assistance   Sitting balance-Leahy Scale: Fair       Standing balance-Leahy Scale: Poor                              Cognition Arousal/Alertness: Awake/alert Behavior During Therapy: WFL for tasks assessed/performed Overall Cognitive Status: No family/caregiver present to determine baseline cognitive functioning                                 General Comments: pt oriented to self only, does not answer or speak in full sentences or appropriately      Exercises General Exercises - Lower Extremity Heel Slides: AROM;Strengthening;Both;10 reps Hip ABduction/ADduction: AROM;Strengthening;Both;10 reps Straight Leg Raises: AROM;Strengthening;Both;10 reps Other Exercises Other Exercises: Pt able to perform exercises with tactile cues and verbal cueing throughout Other Exercises: Pt with noted BM, performed rolling with modA, 2nd assist for safety Other Exercises: RN alerted to abrasion of R elbow    General Comments        Pertinent Vitals/Pain Pain Assessment: Faces Faces Pain Scale: Hurts a little bit Pain Location: with L leg movement Pain Descriptors / Indicators: Grimacing Pain Intervention(s): Limited activity within patient's tolerance;Monitored during session;Repositioned    Home Living  Prior Function            PT Goals (current goals can now be found in the care plan section) Progress towards PT goals: Progressing toward goals    Frequency    Min 2X/week      PT Plan Current plan remains appropriate    Co-evaluation              AM-PAC PT "6 Clicks" Mobility   Outcome Measure  Help needed turning from your back to your side while in a flat bed without using bedrails?: A Little Help needed moving from lying  on your back to sitting on the side of a flat bed without using bedrails?: A Lot Help needed moving to and from a bed to a chair (including a wheelchair)?: A Lot Help needed standing up from a chair using your arms (e.g., wheelchair or bedside chair)?: A Lot Help needed to walk in hospital room?: Total Help needed climbing 3-5 steps with a railing? : Total 6 Click Score: 11    End of Session Equipment Utilized During Treatment: Gait belt Activity Tolerance: Patient tolerated treatment well Patient left: in bed;with bed alarm set Nurse Communication: Mobility status PT Visit Diagnosis: Repeated falls (R29.6);Muscle weakness (generalized) (M62.81);Unsteadiness on feet (R26.81);Other abnormalities of gait and mobility (R26.89);Difficulty in walking, not elsewhere classified (R26.2)     Time: 2831-5176 PT Time Calculation (min) (ACUTE ONLY): 24 min  Charges:  $Therapeutic Exercise: 23-37 mins                     Lieutenant Diego PT, DPT 3:54 PM,04/26/20

## 2020-04-26 NOTE — TOC Progression Note (Signed)
Transition of Care [facility]) - Progression Note    Patient Details  Name: GRACILYN GUNIA MRN: [id number] Date of Birth: 09-19-1946  [facility]) CM/SW Contact  Trenton Founds, RN Phone Number: 04/26/2020, 1:48 PM  Clinical Narrative:   Returned phone call to [facility] CM who had questions. Spoke with Trula Ore who has questions about patient having any needs related to IVAB after discharge. She reported that patient would likely not qualify for rehab due to therapist recommendations but that she would forward on to her medical director to review.  Placed call to patient's daughter Amy to discuss current situation and inform her of possibility that SNF may not be an option.  Received return call from [place] with [employer] with phone number for physician to do a peer to peer with their medical director. Placed call to Dr. Marylu Lund to inform her of same and provide number of [phone]. Dr. Marylu Lund returned call short time later to report that physician deemed patient is not rehab appropriate. Received return call from [employer] to verify denial and provide number for appeals of [phone].  Placed return call to patient's daughter Amy to inform that patient's insurance has denied SNF. Did discuss with her other options including long term memory care units. Amy reports that she is able to look up these facilities.      Expected Discharge Plan: Skilled Nursing Facility Barriers to Discharge: Continued Medical Work up, English as a second language teacher  Expected Discharge Plan and Services Expected Discharge Plan: Skilled Nursing Facility       Living arrangements for the past 2 months: Single Family Home                                       Social Determinants of Health (SDOH) Interventions    Readmission Risk Interventions No flowsheet data found.

## 2020-04-26 NOTE — Progress Notes (Signed)
PROGRESS NOTE    Cheryl Arnold  CZY:[id number] DOB: 1946/03/01 DOA: 04/24/2020 PCP: Kandyce Rud, MD    Brief Narrative:  Cheryl Arnold is a 74 y.o. female with medical history significant for vascular dementia, history of CVA, history of rheumatoid arthritis, hypertension, anxiety and depression who was brought into the emergency room for evaluation of frequent falls.    Consultants:     Procedures:   Antimicrobials:   -Rocephin   Subjective: Pt at baseline confusion from her dementia, no new events  Objective: Vitals:   04/25/20 2121 04/25/20 2128 04/25/20 2133 04/26/20 0622  BP: (!) 181/95 (!) 165/104 (!) 166/94 (!) 152/91  Pulse: 64 66 67 62  Resp: 18   16  Temp: 98.4 F (36.9 C)   99.4 F (37.4 C)  TempSrc: Oral   Oral  SpO2: 100%   100%  Weight:      Height:        Intake/Output Summary (Last 24 hours) at 04/26/2020 0830 Last data filed at 04/26/2020 0932 Gross per 24 hour  Intake 1303.28 ml  Output 850 ml  Net 453.28 ml   Filed Weights   04/24/20 1014  Weight: 52.2 kg    Examination:  General exam: Appears calm and comfortable , confused Respiratory system: Clear to auscultation. Respiratory effort normal.  No wheezing no rails Cardiovascular system: S1 & S2 heard, RRR. No JVD, murmurs, rubs, gallops or clicks.  Gastrointestinal system: Abdomen is nondistended, soft and nontender.  Normal bowel sounds heard.  No rebound Central nervous system:grossly intact, confused appears to be baseine Extremities: no edema Skin: Warm dry Psychiatry:Mood & affect appropriate in current setting.     Data Reviewed: I have personally reviewed following labs and imaging studies  CBC: Recent Labs  Lab 04/24/20 1019 04/25/20 0549  WBC 6.4 5.2  HGB 11.4* 12.0  HCT 34.3* 35.8*  MCV 89.1 88.6  PLT 267 237   Basic Metabolic Panel: Recent Labs  Lab 04/24/20 1019 04/25/20 0549  NA 136 136  K 3.5 3.1*  CL 106 106  CO2 24 24  GLUCOSE 98 91  BUN  24* 13  CREATININE 0.78 0.60  CALCIUM 8.6* 8.1*  MG 2.0  --    GFR: Estimated Creatinine Clearance: 51 mL/min (by C-G formula based on SCr of 0.6 mg/dL). Liver Function Tests: Recent Labs  Lab 04/24/20 1019  AST 25  ALT 17  ALKPHOS 104  BILITOT 0.8  PROT 6.9  ALBUMIN 3.9   No results for input(s): LIPASE, AMYLASE in the last 168 hours. No results for input(s): AMMONIA in the last 168 hours. Coagulation Profile: No results for input(s): INR, PROTIME in the last 168 hours. Cardiac Enzymes: Recent Labs  Lab 04/24/20 1019  CKTOTAL 320*   BNP (last 3 results) No results for input(s): PROBNP in the last 8760 hours. HbA1C: No results for input(s): HGBA1C in the last 72 hours. CBG: No results for input(s): GLUCAP in the last 168 hours. Lipid Profile: No results for input(s): CHOL, HDL, LDLCALC, TRIG, CHOLHDL, LDLDIRECT in the last 72 hours. Thyroid Function Tests: Recent Labs    04/24/20 1019  TSH 6.023*   Anemia Panel: No results for input(s): VITAMINB12, FOLATE, FERRITIN, TIBC, IRON, RETICCTPCT in the last 72 hours. Sepsis Labs: No results for input(s): PROCALCITON, LATICACIDVEN in the last 168 hours.  Recent Results (from the past 240 hour(s))  Urine Culture     Status: Abnormal (Preliminary result)   Collection Time: 04/24/20 11:22 AM  Specimen: Urine, Clean Catch  Result Value Ref Range Status   Specimen Description   Final    URINE, CLEAN CATCH Performed at [facility], [address] [place], [place] [address]    Special Requests   Final    Normal Performed at [facility], [address] [place], [place] [address]    Culture (A)  Final    >=100,000 COLONIES/mL ENTEROCOCCUS FAECALIS SUSCEPTIBILITIES TO FOLLOW Performed at [facility], [address], [place], [place] [address]    Report Status PENDING  Incomplete  SARS CORONAVIRUS 2 (TAT 6-24 HRS) Nasopharyngeal Nasopharyngeal Swab     Status: None   Collection Time:  04/24/20  3:39 PM   Specimen: Nasopharyngeal Swab  Result Value Ref Range Status   SARS Coronavirus 2 NEGATIVE NEGATIVE Final    Comment: (NOTE) SARS-CoV-2 target nucleic acids are NOT DETECTED. The SARS-CoV-2 RNA is generally detectable in upper and lower respiratory specimens during the acute phase of infection. Negative results do not preclude SARS-CoV-2 infection, do not rule out co-infections with other pathogens, and should not be used as the sole basis for treatment or other patient management decisions. Negative results must be combined with clinical observations, patient history, and epidemiological information. The expected result is Negative. Fact Sheet for Patients: HairSlick.no Fact Sheet for Healthcare Providers: quierodirigir.com This test is not yet approved or cleared by the [place] FDA and  has been authorized for detection and/or diagnosis of SARS-CoV-2 by FDA under an Emergency Use Authorization (EUA). This EUA will remain  in effect (meaning this test can be used) for the duration of the COVID-19 declaration under Section 56 4(b)(1) of the Act, 21 U.S.C. section 360bbb-3(b)(1), unless the authorization is terminated or revoked sooner. Performed at [facility], [address] [place], [place] [address]          Radiology Studies: CT Head Wo Contrast  Result Date: 04/24/2020 CLINICAL DATA:  Fall, head trauma, found down EXAM: CT HEAD WITHOUT CONTRAST CT CERVICAL SPINE WITHOUT CONTRAST TECHNIQUE: Multidetector CT imaging of the head and cervical spine was performed following the standard protocol without intravenous contrast. Multiplanar CT image reconstructions of the cervical spine were also generated. COMPARISON:  None. FINDINGS: CT HEAD FINDINGS Brain: No evidence of acute infarction, hemorrhage, hydrocephalus, extra-axial collection or mass lesion/mass effect. Periventricular and deep white  matter hypodensity with areas of encephalomalacia of the left frontal lobe. Vascular: No hyperdense vessel or unexpected calcification. Skull: Normal. Negative for fracture or focal lesion. Sinuses/Orbits: No acute finding. Other: None. CT CERVICAL SPINE FINDINGS Alignment: Degenerative straightening of the normal cervical lordosis. Skull base and vertebrae: No acute fracture. No primary bone lesion or focal pathologic process. Soft tissues and spinal canal: No prevertebral fluid or swelling. No visible canal hematoma. Disc levels: Moderate multilevel disc space height loss and osteophytosis of the lower cervical spine. Upper chest: Negative. Other: None. IMPRESSION: 1.  No acute intracranial pathology. 2. Small-vessel white matter disease and nonacute encephalomalacia of the left frontal lobe in keeping with prior infarction. 3.  No fracture or static subluxation of the cervical spine. 4.  Moderate multilevel disc degenerative disease. Electronically Signed   By: Lauralyn Primes M.D.   On: 04/24/2020 11:58   CT Cervical Spine Wo Contrast  Result Date: 04/24/2020 CLINICAL DATA:  Fall, head trauma, found down EXAM: CT HEAD WITHOUT CONTRAST CT CERVICAL SPINE WITHOUT CONTRAST TECHNIQUE: Multidetector CT imaging of the head and cervical spine was performed following  the standard protocol without intravenous contrast. Multiplanar CT image reconstructions of the cervical spine were also generated. COMPARISON:  None. FINDINGS: CT HEAD FINDINGS Brain: No evidence of acute infarction, hemorrhage, hydrocephalus, extra-axial collection or mass lesion/mass effect. Periventricular and deep white matter hypodensity with areas of encephalomalacia of the left frontal lobe. Vascular: No hyperdense vessel or unexpected calcification. Skull: Normal. Negative for fracture or focal lesion. Sinuses/Orbits: No acute finding. Other: None. CT CERVICAL SPINE FINDINGS Alignment: Degenerative straightening of the normal cervical lordosis.  Skull base and vertebrae: No acute fracture. No primary bone lesion or focal pathologic process. Soft tissues and spinal canal: No prevertebral fluid or swelling. No visible canal hematoma. Disc levels: Moderate multilevel disc space height loss and osteophytosis of the lower cervical spine. Upper chest: Negative. Other: None. IMPRESSION: 1.  No acute intracranial pathology. 2. Small-vessel white matter disease and nonacute encephalomalacia of the left frontal lobe in keeping with prior infarction. 3.  No fracture or static subluxation of the cervical spine. 4.  Moderate multilevel disc degenerative disease. Electronically Signed   By: Eddie Candle M.D.   On: 04/24/2020 11:58        Scheduled Meds: . aspirin EC  81 mg Oral Daily  . enoxaparin (LOVENOX) injection  40 mg Subcutaneous Q24H  . folic acid  2 mg Oral Daily  . methotrexate  2.5 mg Oral Weekly  . pantoprazole  40 mg Oral Daily  . pravastatin  20 mg Oral Nightly  . predniSONE  2 mg Oral Daily  . sertraline  50 mg Oral Daily   Continuous Infusions: . sodium chloride 50 mL/hr at 04/26/20 0508  . cefTRIAXone (ROCEPHIN)  IV Stopped (04/25/20 1418)    Assessment & Plan:   Principal Problem:   Acute lower UTI Active Problems:   Vascular dementia ([place])   Frequent falls   RA (rheumatoid arthritis) ([facility])   Hypertension   Acute UTI Urine culture reveals Enterococcus faecalis We will switch Rocephin to ampicillin   Frequent falls Unclear etiology Possibly due to depakote , as falls appears to have started after initiation of this medication. ]will hold Depakote fall precautions Consult PT/OT PT recommends SNF-24-hour memory care unit   Vascular dementia Patient noted to be bradycardic on the monitor and is also on Aricept Hold Aricept for now Cardiac monitoring over the next 24 - 48 hours Orthostatic blood pressure checks Continue Sertraline Family states they are unable to continue to provide the care the  patient needs at home due to worsening dementia and are seeking possible long-term placement in a skilled nursing facility  Rheumatoid arthritis Continue prednisone and methotrexate   DVT prophylaxis: Lovenox Code Status: DNR Family Communication: none at bedside Disposition Plan: Back to previous home environment vs snf. Family cannot take care of it. Barrier: may need placement as family cannot take care of her, social work working on this         LOS: 2 days   Time spent: 45 minutes with more than 50% COC    Nolberto Hanlon, MD [facility] Pager [phone]  If 7PM-7AM, please contact night-coverage [email or web address] Password [email or web address] 04/26/2020, 8:30 AM Patient ID: Cheryl Arnold, female   DOB: 04/09/1946, 73 y.o.   MRN: [id number]

## 2020-04-27 ENCOUNTER — Other Ambulatory Visit: Payer: Self-pay

## 2020-04-27 LAB — BASIC METABOLIC PANEL
Anion gap: 7 (ref 5–15)
BUN: 10 mg/dL (ref 8–23)
CO2: 23 mmol/L (ref 22–32)
Calcium: 8.2 mg/dL — ABNORMAL LOW (ref 8.9–10.3)
Chloride: 104 mmol/L (ref 98–111)
Creatinine, Ser: 0.56 mg/dL (ref 0.44–1.00)
GFR calc Af Amer: >60 mL/min (ref >60)
GFR calc non Af Amer: >60 mL/min (ref >60)
Glucose, Bld: 146 mg/dL — ABNORMAL HIGH (ref 70–99)
Potassium: 3.3 mmol/L — ABNORMAL LOW (ref 3.5–5.1)
Sodium: 134 mmol/L — ABNORMAL LOW (ref 135–145)

## 2020-04-27 LAB — URINE CULTURE
Culture: >=100000 — AB
Special Requests: NORMAL

## 2020-04-27 MED ORDER — AMLODIPINE BESYLATE 5 MG PO TABS
5.0000 mg | ORAL_TABLET | Freq: Every day | ORAL | Status: DC
  Administered 2020-04-27 – 2020-04-28 (×2): 5 mg via ORAL
  Filled 2020-04-27 (×2): qty 1

## 2020-04-27 NOTE — Progress Notes (Addendum)
PROGRESS NOTE    Cheryl Arnold  KGM:[id number] DOB: 1946/04/17 DOA: 04/24/2020 PCP: Kandyce Rud, MD    Brief Narrative:  Cheryl Arnold is a 74 y.o. female with medical history significant for vascular dementia, history of CVA, history of rheumatoid arthritis, hypertension, anxiety and depression who was brought into the emergency room for evaluation of frequent falls.    Consultants:     Procedures:   Antimicrobials:   -Rocephin d/c'd 5/3  Ampicillin 5/3   Subjective: At baseline mental status. Has no complaitns.  Objective: Vitals:   04/26/20 2008 04/27/20 0615 04/27/20 1113 04/27/20 1156  BP: (!) 186/90 (!) 170/89 (!) 142/92 (!) 144/86  Pulse: 60 (!) 55  69  Resp: 20 20  16   Temp: 98.5 F (36.9 C) 98.4 F (36.9 C)  97.7 F (36.5 C)  TempSrc: Oral Oral  Oral  SpO2: 100% 100%    Weight:      Height:        Intake/Output Summary (Last 24 hours) at 04/27/2020 1513 Last data filed at 04/27/2020 0500 Gross per 24 hour  Intake 1347.23 ml  Output 550 ml  Net 797.23 ml   Filed Weights   04/24/20 1014  Weight: 52.2 kg    Examination:  General exam: Appears calm and comfortable , confused Respiratory system: Clear to auscultation. Respiratory effort normal.  No wheezing no rales, or rhonchi Cardiovascular system: S1 & S2 heard, RRR. No JVD, murmurs, rubs, gallops or clicks.  Gastrointestinal system: Abdomen is nondistended, soft and nontender.  Normal bowel sounds heard.  No rebound Central nervous system:grossly intact, awake, alert, not oriented, at baseline Extremities: no edema Skin: Warm dry Psychiatry:Mood & affect appropriate in current setting.     Data Reviewed: I have personally reviewed following labs and imaging studies  CBC: Recent Labs  Lab 04/24/20 1019 04/25/20 0549  WBC 6.4 5.2  HGB 11.4* 12.0  HCT 34.3* 35.8*  MCV 89.1 88.6  PLT 267 237   Basic Metabolic Panel: Recent Labs  Lab 04/24/20 1019 04/25/20 0549  04/27/20 0947  NA 136 136 134*  K 3.5 3.1* 3.3*  CL 106 106 104  CO2 24 24 23   GLUCOSE 98 91 146*  BUN 24* 13 10  CREATININE 0.78 0.60 0.56  CALCIUM 8.6* 8.1* 8.2*  MG 2.0  --   --    GFR: Estimated Creatinine Clearance: 51 mL/min (by C-G formula based on SCr of 0.56 mg/dL). Liver Function Tests: Recent Labs  Lab 04/24/20 1019  AST 25  ALT 17  ALKPHOS 104  BILITOT 0.8  PROT 6.9  ALBUMIN 3.9   No results for input(s): LIPASE, AMYLASE in the last 168 hours. No results for input(s): AMMONIA in the last 168 hours. Coagulation Profile: No results for input(s): INR, PROTIME in the last 168 hours. Cardiac Enzymes: Recent Labs  Lab 04/24/20 1019  CKTOTAL 320*   BNP (last 3 results) No results for input(s): PROBNP in the last 8760 hours. HbA1C: No results for input(s): HGBA1C in the last 72 hours. CBG: No results for input(s): GLUCAP in the last 168 hours. Lipid Profile: No results for input(s): CHOL, HDL, LDLCALC, TRIG, CHOLHDL, LDLDIRECT in the last 72 hours. Thyroid Function Tests: No results for input(s): TSH, T4TOTAL, FREET4, T3FREE, THYROIDAB in the last 72 hours. Anemia Panel: No results for input(s): VITAMINB12, FOLATE, FERRITIN, TIBC, IRON, RETICCTPCT in the last 72 hours. Sepsis Labs: No results for input(s): PROCALCITON, LATICACIDVEN in the last 168 hours.  Recent Results (  from the past 240 hour(s))  Urine Culture     Status: Abnormal   Collection Time: 04/24/20 11:22 AM   Specimen: Urine, Clean Catch  Result Value Ref Range Status   Specimen Description   Final    URINE, CLEAN CATCH Performed at [facility], [address], [place], [place] [address]    Special Requests   Final    Normal Performed at [facility], [place], [place] [address]    Culture >=100,000 COLONIES/mL ENTEROCOCCUS FAECALIS (A)  Final   Report Status 04/27/2020 FINAL  Final   Organism ID, Bacteria ENTEROCOCCUS FAECALIS (A)  Final       Susceptibility   Enterococcus faecalis - MIC*    AMPICILLIN <=2 SENSITIVE Sensitive     NITROFURANTOIN <=16 SENSITIVE Sensitive     VANCOMYCIN 1 SENSITIVE Sensitive     * >=100,000 COLONIES/mL ENTEROCOCCUS FAECALIS  SARS CORONAVIRUS 2 (TAT 6-24 HRS) Nasopharyngeal Nasopharyngeal Swab     Status: None   Collection Time: 04/24/20  3:39 PM   Specimen: Nasopharyngeal Swab  Result Value Ref Range Status   SARS Coronavirus 2 NEGATIVE NEGATIVE Final    Comment: (NOTE) SARS-CoV-2 target nucleic acids are NOT DETECTED. The SARS-CoV-2 RNA is generally detectable in upper and lower respiratory specimens during the acute phase of infection. Negative results do not preclude SARS-CoV-2 infection, do not rule out co-infections with other pathogens, and should not be used as the sole basis for treatment or other patient management decisions. Negative results must be combined with clinical observations, patient history, and epidemiological information. The expected result is Negative. Fact Sheet for Patients: SugarRoll.be Fact Sheet for Healthcare Providers: https://www.woods-mathews.com/ This test is not yet approved or cleared by the [place] FDA and  has been authorized for detection and/or diagnosis of SARS-CoV-2 by FDA under an Emergency Use Authorization (EUA). This EUA will remain  in effect (meaning this test can be used) for the duration of the COVID-19 declaration under Section 56 4(b)(1) of the Act, 21 U.S.C. section 360bbb-3(b)(1), unless the authorization is terminated or revoked sooner. Performed at [facility], [place] [address] [place], [place] [address]          Radiology Studies: No results found.      Scheduled Meds: . amLODipine  5 mg Oral Daily  . ampicillin  500 mg Oral Q6H  . aspirin EC  81 mg Oral Daily  . enoxaparin (LOVENOX) injection  40 mg Subcutaneous Q24H  . folic acid  2 mg Oral Daily  . methotrexate   10 mg Oral Weekly  . pantoprazole  40 mg Oral Daily  . pravastatin  20 mg Oral Nightly  . predniSONE  2 mg Oral Daily  . sertraline  50 mg Oral Daily   Continuous Infusions: . sodium chloride 50 mL/hr at 04/27/20 0943    Assessment & Plan:   Principal Problem:   Acute lower UTI Active Problems:   Vascular dementia ([place])   Frequent falls   RA (rheumatoid arthritis) ([facility])   Hypertension   Acute UTI Urine culture reveals Enterococcus faecalis  switched Rocephin to ampicillin, will continue  Htn-start amlodipine  Frequent falls Unclear etiology Possibly due to depakote , as falls appears to have started after initiation of this medication. ]will hold Depakote fall precautions Consult PT/OT PT recommends SNF-24-hour memory care unit   Vascular dementia Patient noted to be bradycardic on the monitor and is also on Aricept Hold Aricept for now Cardiac  monitoring over the next 24 - 48 hours Orthostatic blood pressure checks Continue Sertraline Family states they are unable to continue to provide the care the patient needs at home due to worsening dementia and are seeking possible long-term placement in a skilled nursing facility  Rheumatoid arthritis Continue prednisone and methotrexate   DVT prophylaxis: Lovenox Code Status: DNR Family Communication: none at bedside Disposition Plan: Back to previous home environment vs snf. Family cannot take care of it. Barrier:social work working on Doctor, general practice, will know on 5/5       LOS: 3 days   Time spent: 45 minutes with more than 50% COC    Lynn Ito, MD [facility] Pager [phone]  If 7PM-7AM, please contact night-coverage [email or web address] Password [email or web address] 04/27/2020, 3:13 PM Patient ID: Cheryl Arnold, female   DOB: June 10, 1946, 74 y.o.   MRN: [id number]

## 2020-04-27 NOTE — TOC Progression Note (Addendum)
Transition of Care [facility]) - Progression Note    Patient Details  Name: Cheryl Arnold MRN: [id number] Date of Birth: October 02, 1946  [facility] CM/SW Contact  Chapman Fitch, RN Phone Number: 04/27/2020, 2:32 PM  Clinical Narrative:     PT saw patient again yesterday "Able to follow commands intermittently, verbal/tactile cues and facilitation to accomplish functional mobility"  RNCM reached out to daughter and should would like to see if insurance would approve now that patient is able to follow some commands.   RNCM presented bed offers to daughter.  [facility] was selected.  Accepted in HUB   MD updated  Insurance auth initiated .  Reference number [id number]  Per nursing telesitter has been discontinued   Expected Discharge Plan: Skilled Nursing Facility Barriers to Discharge: Continued Medical Work up, English as a second language teacher  Expected Discharge Plan and Services Expected Discharge Plan: Skilled Nursing Facility       Living arrangements for the past 2 months: Single Family Home                                       Social Determinants of Health (SDOH) Interventions    Readmission Risk Interventions No flowsheet data found.

## 2020-04-27 NOTE — Care Management Important Message (Signed)
Important Message  Patient Details  Name: Cheryl Arnold MRN: [id number] Date of Birth: 22-Apr-1946   Medicare Important Message Given:  Yes     Olegario Messier A Mattheus Rauls 04/27/2020, 10:21 AM

## 2020-04-28 DIAGNOSIS — I10 Essential (primary) hypertension: Secondary | ICD-10-CM

## 2020-04-28 LAB — BASIC METABOLIC PANEL
Anion gap: 9 (ref 5–15)
BUN: 15 mg/dL (ref 8–23)
CO2: 20 mmol/L — ABNORMAL LOW (ref 22–32)
Calcium: 8.2 mg/dL — ABNORMAL LOW (ref 8.9–10.3)
Chloride: 104 mmol/L (ref 98–111)
Creatinine, Ser: 0.61 mg/dL (ref 0.44–1.00)
GFR calc Af Amer: >60 mL/min (ref >60)
GFR calc non Af Amer: >60 mL/min (ref >60)
Glucose, Bld: 162 mg/dL — ABNORMAL HIGH (ref 70–99)
Potassium: 3.6 mmol/L (ref 3.5–5.1)
Sodium: 133 mmol/L — ABNORMAL LOW (ref 135–145)

## 2020-04-28 LAB — MAGNESIUM: Magnesium: 2 mg/dL (ref 1.7–2.4)

## 2020-04-28 MED ORDER — AMPICILLIN 500 MG PO CAPS: 500.0000 mg | ORAL_CAPSULE | Freq: Four times a day (QID) | ORAL | 0 refills | Status: AC

## 2020-04-28 NOTE — Progress Notes (Signed)
Physical Therapy Treatment Patient Details Name: Cheryl Arnold MRN: [id number] DOB: 1946-11-23 Today's Date: 04/28/2020    History of Present Illness 74 year old female from home with frequent falls, admitted with acute lower UTI. PMH includes: dementia, CVA, RA, HTN.    PT Comments    Patient received alert in bed, pleasant. Unable to answer questions, or tell me name. She follows direction for bed exercises with gesturing and cues. Resists knee flexion bilaterally and grimaces with attempts. She will continue to benefit from skilled PT to improve functional mobility and strength. Although I believe a familiar environment would be beneficial if at all possible.        Follow Up Recommendations  SNF;Supervision/Assistance - 24 hour     Equipment Recommendations  None recommended by PT;Other (comment)(TBD)    Recommendations for Other Services       Precautions / Restrictions Precautions Precautions: Fall Restrictions Weight Bearing Restrictions: No    Mobility  Bed Mobility Overal bed mobility: Needs Assistance Bed Mobility: Supine to Sit     Supine to sit: Max assist     General bed mobility comments: patient resisting movement of LEs with grimacing noted. Esepcially with attempts to bend knees.  Transfers                 General transfer comment: unable  Ambulation/Gait             General Gait Details: unable   Stairs             Wheelchair Mobility    Modified Rankin (Stroke Patients Only)       Balance                                            Cognition Arousal/Alertness: Awake/alert Behavior During Therapy: WFL for tasks assessed/performed Overall Cognitive Status: No family/caregiver present to determine baseline cognitive functioning                                 General Comments: pt oriented to self only, does not answer or speak in full sentences or appropriately      Exercises  Other Exercises Other Exercises: Patient requires assistance and gesturing to perform LE exercises. B LE exercise to include: hip abd/add, SLR, AP x 10 reps with assist.    General Comments        Pertinent Vitals/Pain Pain Assessment: Faces Faces Pain Scale: Hurts even more Pain Location: with B knee movement Pain Descriptors / Indicators: Discomfort;Grimacing;Guarding Pain Intervention(s): Limited activity within patient's tolerance;Monitored during session    Home Living                      Prior Function            PT Goals (current goals can now be found in the care plan section) Acute Rehab PT Goals Patient Stated Goal: none stated PT Goal Formulation: Patient unable to participate in goal setting Time For Goal Achievement: 05/09/20 Potential to Achieve Goals: Fair Progress towards PT goals: Not progressing toward goals - comment(cognition limits progress)    Frequency    Min 2X/week      PT Plan Current plan remains appropriate    Co-evaluation  AM-PAC PT "6 Clicks" Mobility   Outcome Measure  Help needed turning from your back to your side while in a flat bed without using bedrails?: A Little Help needed moving from lying on your back to sitting on the side of a flat bed without using bedrails?: A Lot Help needed moving to and from a bed to a chair (including a wheelchair)?: A Lot Help needed standing up from a chair using your arms (e.g., wheelchair or bedside chair)?: Total Help needed to walk in hospital room?: Total Help needed climbing 3-5 steps with a railing? : Total 6 Click Score: 10    End of Session   Activity Tolerance: Patient tolerated treatment well Patient left: in bed;with bed alarm set Nurse Communication: Mobility status PT Visit Diagnosis: Repeated falls (R29.6);Muscle weakness (generalized) (M62.81);Unsteadiness on feet (R26.81);Other abnormalities of gait and mobility (R26.89);Difficulty in walking, not  elsewhere classified (R26.2)     Time: 0992-7800 PT Time Calculation (min) (ACUTE ONLY): 15 min  Charges:  $Therapeutic Exercise: 8-22 mins                     Dalani Mette, PT, GCS 04/28/20,2:58 PM

## 2020-04-28 NOTE — TOC Transition Note (Signed)
Transition of Care [facility]) - CM/SW Discharge Note   Patient Details  Name: Cheryl Arnold MRN: [id number] Date of Birth: October 25, 1946  [facility]) CM/SW Contact:  Chapman Fitch, RN Phone Number: 04/28/2020, 3:52 PM   Clinical Narrative:    Insurance has approved SNF PASRR pending.  Tiffany at [employer] notified  Patient to transport via EMS EMS called EMS packet and DNR on chart.  Bedside RN to call report    Final next level of care: Skilled Nursing Facility Barriers to Discharge: No Barriers Identified   Patient Goals and CMS Choice   CMS Medicare.gov Compare Post Acute Care list provided to:: Patient Represenative (must comment)(dtr/POA)    Discharge Placement              Patient [facility] bed at: [facility] Patient to be transferred to facility by: EMS Name of family member notified: daughter Patient and family notified of of transfer: 04/28/20  Discharge Plan and Services                                     Social Determinants of Health (SDOH) Interventions     Readmission Risk Interventions No flowsheet data found.

## 2020-04-28 NOTE — Progress Notes (Signed)
PROGRESS NOTE    Cheryl Arnold  TAV:[id number] DOB: 03-31-1946 DOA: 04/24/2020 PCP: Derinda Late, MD (Confirm with patient/family/NH records and if not entered, this HAS to be entered at [facility] point of entry. "No PCP" if truly none.)   Brief Narrative: (Start on day 1 of progress note - keep it brief and live) Cheryl C Allisonis a 74 y.o.femalewith medical history significant forvascular dementia,history of CVA,history of rheumatoid arthritis, hypertension, anxiety and depression who was brought into the emergency room for evaluation of frequent falls.  Patient was found to have urinary tract infection secondary to Enterococcus faecalis.  Treated with ampicillin. Patient will be discharged to nursing home for rehab, pending insurance approval.   Assessment & Plan:   Principal Problem:   Acute lower UTI Active Problems:   Vascular dementia ([place])   Frequent falls   RA (rheumatoid arthritis) ([place])   Hypertension  #1.  Acute cystitis without hematuria secondary to Enterococcus faecalis.  Condition improving, continue ampicillin for now.  2.  Frequent falls. Patient evaluated by PT OT.  Will discharge to nursing home for rehab.  3.  Vascular dementia. Patient is totally confused.  We will continue to follow.  4.  Rheumatoid arthritis.  Follow.   DVT prophylaxis: Lovenox Code Status: DNR Family Communication: None  Disposition Plan:  . Patient came from:             . Anticipated d/c place: ECF . Barriers to d/c OR conditions which need to be met to effect a safe d/c:   Consultants:   None   Procedures: None  Antimicrobials: Ampicillin.  Subjective: Patient is very confused, but not in any discomfort. I spoke with the nurse, patient currently does not have any short of breath or cough. No fever or chills.  Objective: Vitals:   04/27/20 1113 04/27/20 1156 04/27/20 1928 04/28/20 0446  BP: (!) 142/92 (!) 144/86 (!) 157/93 (!) 153/77  Pulse:  69 79 62   Resp:  '16 18 18  '$ Temp:  97.7 F (36.5 C) 98.2 F (36.8 C) 98.1 F (36.7 C)  TempSrc:  Oral    SpO2:   98% 100%  Weight:      Height:        Intake/Output Summary (Last 24 hours) at 04/28/2020 1347 Last data filed at 04/28/2020 1053 Gross per 24 hour  Intake 1395.52 ml  Output 1200 ml  Net 195.52 ml   Filed Weights   04/24/20 1014  Weight: 52.2 kg    Examination:  General exam: Appears calm and comfortable  Respiratory system: Clear to auscultation. Respiratory effort normal. Cardiovascular system: S1 & S2 heard, RRR. No JVD, murmurs, rubs, gallops or clicks. No pedal edema. Gastrointestinal system: Abdomen is nondistended, soft and nontender. No organomegaly or masses felt. Normal bowel sounds heard. Central nervous system: Alert and confused. No focal neurological deficits. Extremities: Symmetric Skin: No rashes, lesions or ulcers Psychiatry: Not able to evaluate.    Data Reviewed: I have personally reviewed following labs and imaging studies  CBC: Recent Labs  Lab 04/24/20 1019 04/25/20 0549  WBC 6.4 5.2  HGB 11.4* 12.0  HCT 34.3* 35.8*  MCV 89.1 88.6  PLT 267 553   Basic Metabolic Panel: Recent Labs  Lab 04/24/20 1019 04/25/20 0549 04/27/20 0947 04/28/20 1310  NA 136 136 134* 133*  K 3.5 3.1* 3.3* 3.6  CL 106 106 104 104  CO2 '24 24 23 '$ 20*  GLUCOSE 98 91 146* 162*  BUN 24* 13 10  15  CREATININE 0.78 0.60 0.56 0.61  CALCIUM 8.6* 8.1* 8.2* 8.2*  MG 2.0  --   --  2.0   GFR: Estimated Creatinine Clearance: 51 mL/min (by C-G formula based on SCr of 0.61 mg/dL). Liver Function Tests: Recent Labs  Lab 04/24/20 1019  AST 25  ALT 17  ALKPHOS 104  BILITOT 0.8  PROT 6.9  ALBUMIN 3.9   No results for input(s): LIPASE, AMYLASE in the last 168 hours. No results for input(s): AMMONIA in the last 168 hours. Coagulation Profile: No results for input(s): INR, PROTIME in the last 168 hours. Cardiac Enzymes: Recent Labs  Lab 04/24/20 1019  CKTOTAL  320*   BNP (last 3 results) No results for input(s): PROBNP in the last 8760 hours. HbA1C: No results for input(s): HGBA1C in the last 72 hours. CBG: No results for input(s): GLUCAP in the last 168 hours. Lipid Profile: No results for input(s): CHOL, HDL, LDLCALC, TRIG, CHOLHDL, LDLDIRECT in the last 72 hours. Thyroid Function Tests: No results for input(s): TSH, T4TOTAL, FREET4, T3FREE, THYROIDAB in the last 72 hours. Anemia Panel: No results for input(s): VITAMINB12, FOLATE, FERRITIN, TIBC, IRON, RETICCTPCT in the last 72 hours. Sepsis Labs: No results for input(s): PROCALCITON, LATICACIDVEN in the last 168 hours.  Recent Results (from the past 240 hour(s))  Urine Culture     Status: Abnormal   Collection Time: 04/24/20 11:22 AM   Specimen: Urine, Clean Catch  Result Value Ref Range Status   Specimen Description   Final    URINE, CLEAN CATCH Performed at [facility], [address] [place], [place] [address]    Special Requests   Final    Normal Performed at [facility], [place], [place] [address]    Culture >=100,000 COLONIES/mL ENTEROCOCCUS FAECALIS (A)  Final   Report Status 04/27/2020 FINAL  Final   Organism ID, Bacteria ENTEROCOCCUS FAECALIS (A)  Final      Susceptibility   Enterococcus faecalis - MIC*    AMPICILLIN <=2 SENSITIVE Sensitive     NITROFURANTOIN <=16 SENSITIVE Sensitive     VANCOMYCIN 1 SENSITIVE Sensitive     * >=100,000 COLONIES/mL ENTEROCOCCUS FAECALIS  SARS CORONAVIRUS 2 (TAT 6-24 HRS) Nasopharyngeal Nasopharyngeal Swab     Status: None   Collection Time: 04/24/20  3:39 PM   Specimen: Nasopharyngeal Swab  Result Value Ref Range Status   SARS Coronavirus 2 NEGATIVE NEGATIVE Final    Comment: (NOTE) SARS-CoV-2 target nucleic acids are NOT DETECTED. The SARS-CoV-2 RNA is generally detectable in upper and lower respiratory specimens during the acute phase of infection. Negative results do not preclude  SARS-CoV-2 infection, do not rule out co-infections with other pathogens, and should not be used as the sole basis for treatment or other patient management decisions. Negative results must be combined with clinical observations, patient history, and epidemiological information. The expected result is Negative. Fact Sheet for Patients: SugarRoll.be Fact Sheet for Healthcare Providers: https://www.woods-mathews.com/ This test is not yet approved or cleared by the [place] FDA and  has been authorized for detection and/or diagnosis of SARS-CoV-2 by FDA under an Emergency Use Authorization (EUA). This EUA will remain  in effect (meaning this test can be used) for the duration of the COVID-19 declaration under Section 56 4(b)(1) of the Act, 21 U.S.C. section 360bbb-3(b)(1), unless the authorization is terminated or revoked sooner. Performed at [facility], [place] [address] [place], [place] [address]  Radiology Studies: No results found.      Scheduled Meds: . amLODipine  5 mg Oral Daily  . ampicillin  500 mg Oral Q6H  . aspirin EC  81 mg Oral Daily  . enoxaparin (LOVENOX) injection  40 mg Subcutaneous Q24H  . folic acid  2 mg Oral Daily  . methotrexate  10 mg Oral Weekly  . pantoprazole  40 mg Oral Daily  . pravastatin  20 mg Oral Nightly  . predniSONE  2 mg Oral Daily  . sertraline  50 mg Oral Daily   Continuous Infusions: . sodium chloride 50 mL/hr at 04/28/20 0557     LOS: 4 days    Time spent: 26 minutes   Sharen Hones, MD [facility]   To contact the attending provider between 7A-7P or the covering provider during after hours 7P-7A, please log into the web site [email or web address] and access using universal [place] password for that web site. If you do not have the password, please call the hospital operator.  04/28/2020, 1:47 PM

## 2020-04-28 NOTE — Discharge Summary (Signed)
Physician Discharge Summary  Patient ID: Cheryl Arnold MRN: [id number] DOB/AGE: 72-Jan-1947 72 y.o.  Admit date: 04/24/2020 Discharge date: 04/28/2020  Admission Diagnoses: UTI Discharge Diagnoses:  Principal Problem:   Acute lower UTI Active Problems:   Vascular dementia (HCC)   Frequent falls   RA (rheumatoid arthritis) (HCC)   Hypertension   Discharged Condition: good  Hospital Course:  Cheryl Arnold a 74 y.o.femalewith medical history significant forvascular dementia,history of CVA,history of rheumatoid arthritis, hypertension, anxiety and depression who was brought into the emergency room for evaluation of frequent falls.  Patient was found to have urinary tract infection secondary to Enterococcus faecalis.  Treated with ampicillin. Patient will be discharged to nursing home for rehab.  #1.  Acute cystitis without hematuria secondary to Enterococcus faecalis.  Condition improving, continue ampicillin for now.  2.  Frequent falls. Patient evaluated by PT OT.  Will discharge to nursing home for rehab.  3.  Vascular dementia. Patient is totally confused.  We will continue to follow.  4.  Rheumatoid arthritis.  Follow.  Consults: None  Significant Diagnostic Studies: Urine culture  Treatments: ampicilin  Discharge Exam: Blood pressure 122/72, pulse 75, temperature 98.3 F (36.8 C), temperature source Oral, resp. rate 18, height 5\' 2"  (1.575 m), weight 52.2 kg, SpO2 100 %. General appearance: Alert, confused.  Oriented to person only. Resp: Clear without wheezing crackles. Cardio: No murmurs.  Regular GI: No tenderness no distention. Extremities: No edema.  Disposition: Discharge disposition: 03-Skilled Nursing Facility       Discharge Instructions    Diet - low sodium heart healthy   Complete by: As directed    Increase activity slowly   Complete by: As directed      Allergies as of 04/28/2020   No Active Allergies     Medication  List    TAKE these medications   ampicillin 500 MG capsule Commonly known as: PRINCIPEN Take 1 capsule (500 mg total) by mouth every 6 (six) hours for 4 days.   aspirin EC 81 MG tablet Take 81 mg by mouth daily.   divalproex 250 MG 24 hr tablet Commonly known as: DEPAKOTE ER   donepezil 10 MG tablet Commonly known as: ARICEPT Take 10 mg by mouth Nightly.   methotrexate 2.5 MG tablet Commonly known as: RHEUMATREX Take 10 mg by mouth once a week.   omeprazole 20 MG capsule Commonly known as: PRILOSEC Take 20 mg by mouth daily.   pravastatin 20 MG tablet Commonly known as: PRAVACHOL Take 20 mg by mouth Nightly.   predniSONE 1 MG tablet Commonly known as: DELTASONE Take 2 mg by mouth daily.   sertraline 50 MG tablet Commonly known as: ZOLOFT Take 50 mg by mouth daily.      Contact information for after-discharge care    Destination    [facility] .   Service: Skilled Nursing Contact information: [address] [place] [phone]              Signed: [phone] 04/28/2020, 2:18 PM

## 2020-06-22 IMAGING — CT CT CERVICAL SPINE W/O CM
3 of 4 series · 11 of 33 positions shown, 13 images · non-contrast
Comparison: None.

CLINICAL DATA: Fall, head trauma, found down

EXAM:
CT HEAD WITHOUT CONTRAST
CT CERVICAL SPINE WITHOUT CONTRAST
TECHNIQUE: Multidetector CT imaging of the head and cervical spine was
performed following the standard protocol without intravenous
contrast. Multiplanar CT image reconstructions of the cervical spine
were also generated.

[Series 4: sagittal bone · sagittal · 0.22mm/px · 5 of 55 slices shown, 6 images]
[im 19/55  bone]
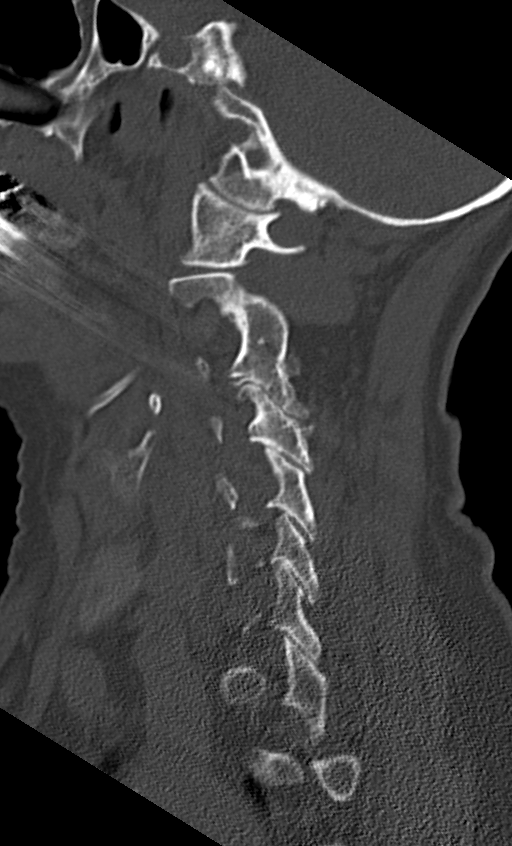
[im 23/55  bone]
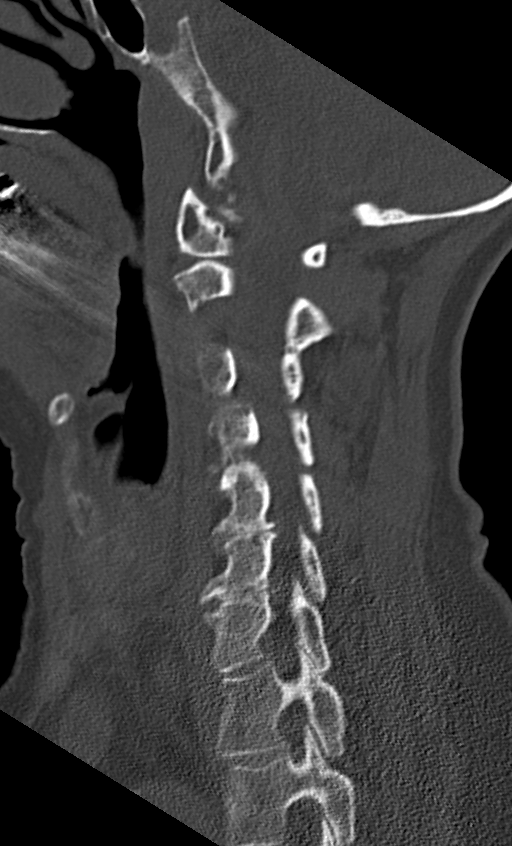
[im 28/55  soft-tissue]
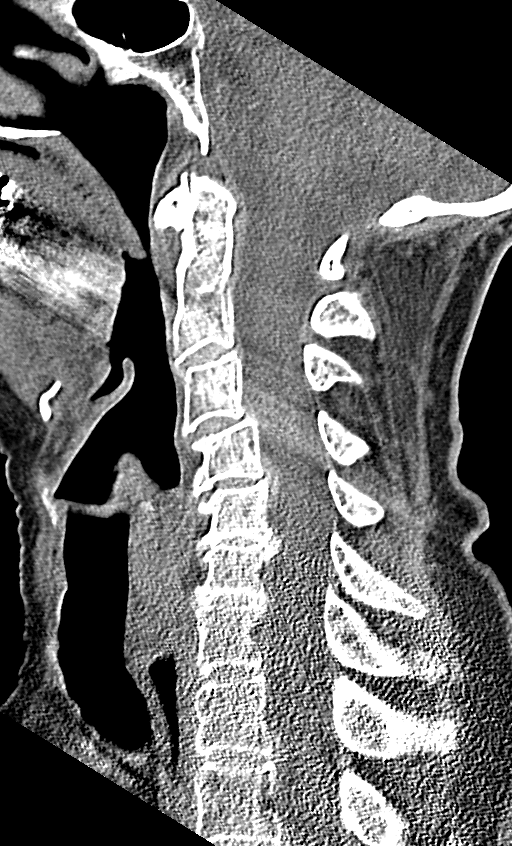
[im 28/55  bone]
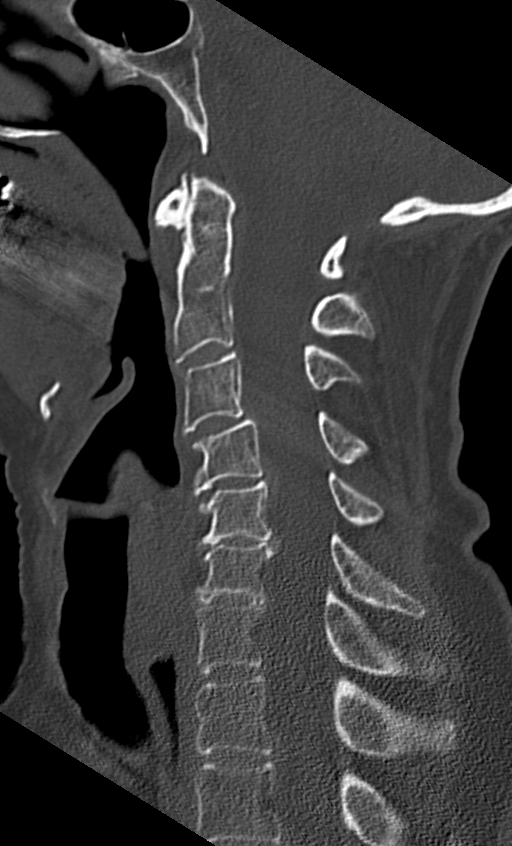
[im 32/55  bone]
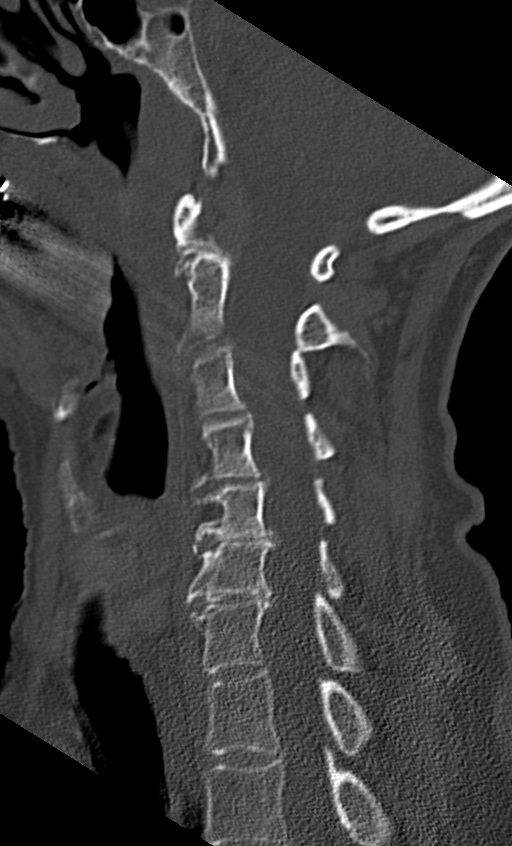
[im 37/55  bone]
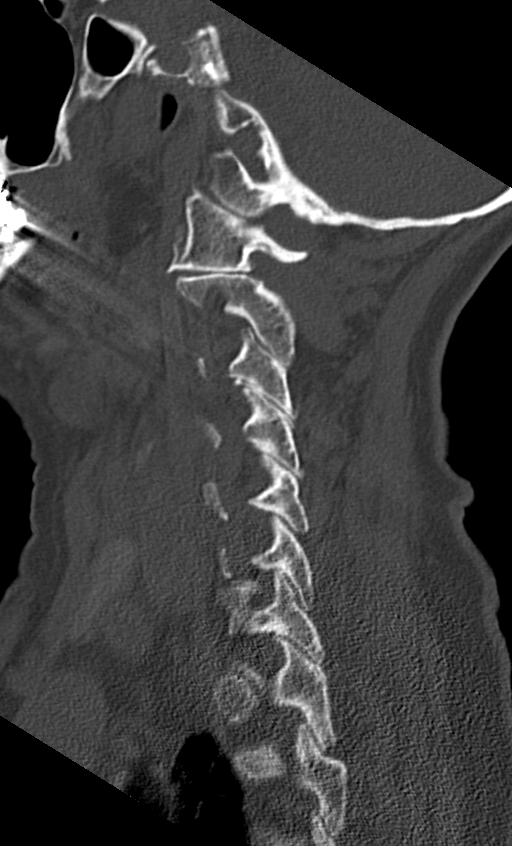

[Series 5: coronal bone · coronal · 0.21mm/px · 3 of 58 slices shown]
[im 15/58  bone]
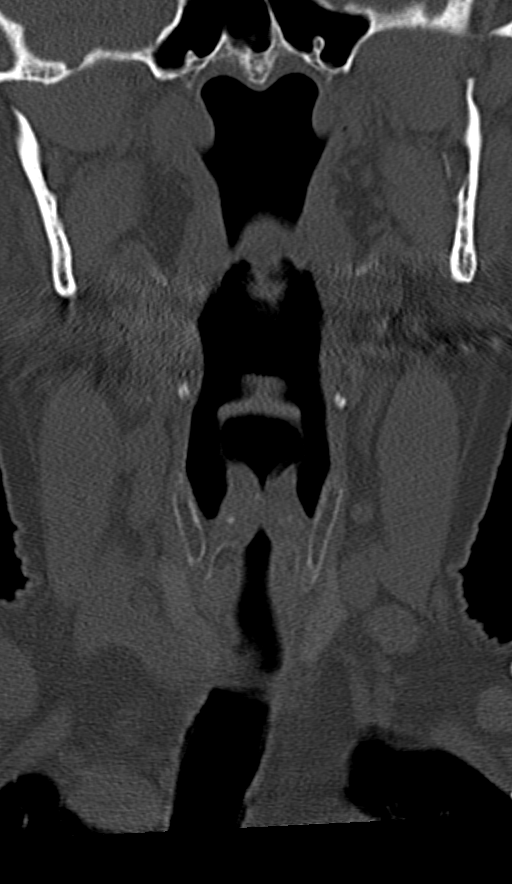
[im 24/58  bone]
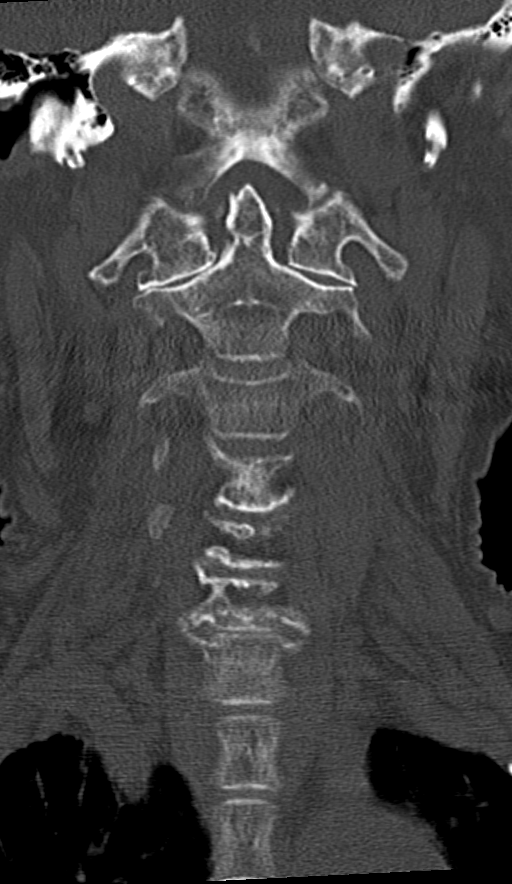
[im 34/58  bone]
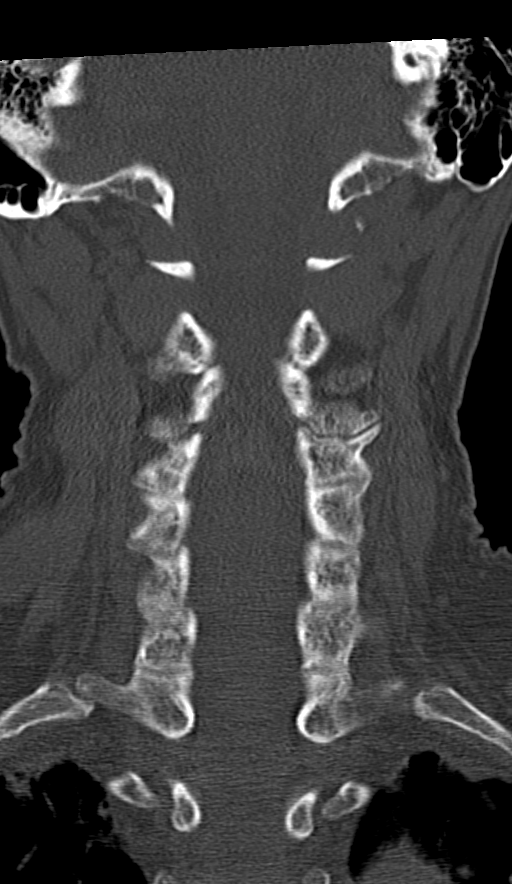

[Series 6: orthogonal bone · axial · 0.21mm/px · z∈[-262,-171]mm · 3 of 96 slices shown, 4 images]
[im 28/96  soft-tissue]
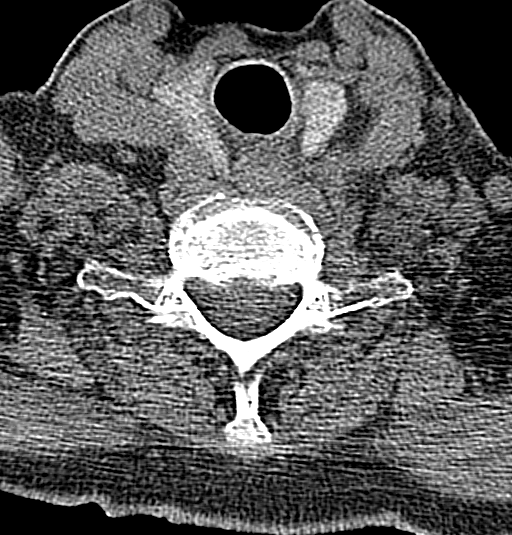
[im 28/96  bone]
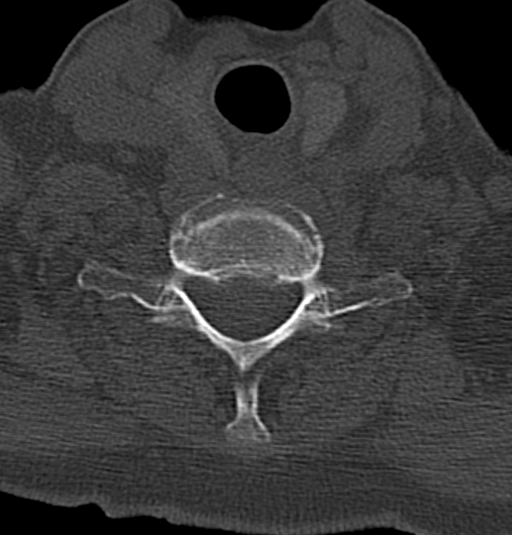
[im 55/96  bone]
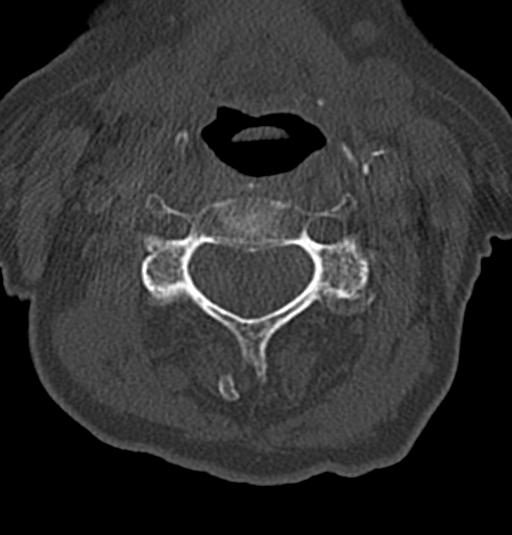
[im 82/96  bone]
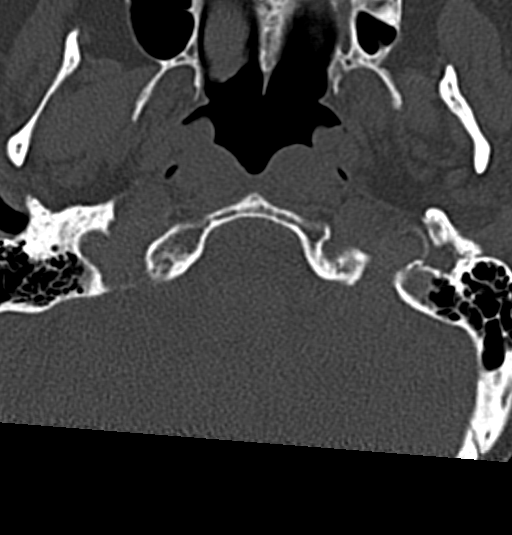

[11 of 33 positions shown; findings below may reference images not displayed]

FINDINGS: CT HEAD FINDINGS

Brain: No evidence of acute infarction, hemorrhage, hydrocephalus,
extra-axial collection or mass lesion/mass effect. Periventricular
and deep white matter hypodensity with areas of encephalomalacia of
the left frontal lobe.

Vascular: No hyperdense vessel or unexpected calcification.

Skull: Normal. Negative for fracture or focal lesion.

Sinuses/Orbits: No acute finding.

Other: None.

CT CERVICAL SPINE FINDINGS

Alignment: Degenerative straightening of the normal cervical
lordosis.

Skull base and vertebrae: No acute fracture. No primary bone lesion
or focal pathologic process.

Soft tissues and spinal canal: No prevertebral fluid or swelling. No
visible canal hematoma.

Disc levels: Moderate multilevel disc space height loss and
osteophytosis of the lower cervical spine.

Upper chest: Negative.

Other: None.
IMPRESSION: 1.  No acute intracranial pathology.

2. Small-vessel white matter disease and nonacute encephalomalacia
of the left frontal lobe in keeping with prior infarction.

3.  No fracture or static subluxation of the cervical spine.

4.  Moderate multilevel disc degenerative disease.

## 2020-06-22 IMAGING — CT CT HEAD W/O CM
4 series · 16 of 47 positions shown, 18 images · non-contrast
Comparison: None.

CLINICAL DATA: Fall, head trauma, found down

EXAM:
CT HEAD WITHOUT CONTRAST
CT CERVICAL SPINE WITHOUT CONTRAST
TECHNIQUE: Multidetector CT imaging of the head and cervical spine was
performed following the standard protocol without intravenous
contrast. Multiplanar CT image reconstructions of the cervical spine
were also generated.

[Series 2: head bone · axial · 0.41mm/px · z∈[-123,-97]mm · 3 of 67 slices shown]
[im 7/67  bone]
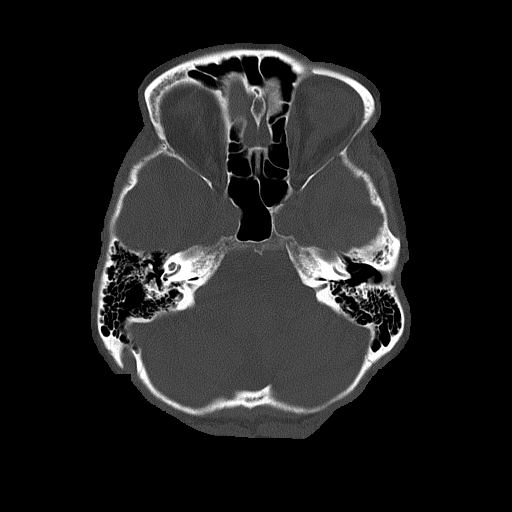
[im 14/67  bone]
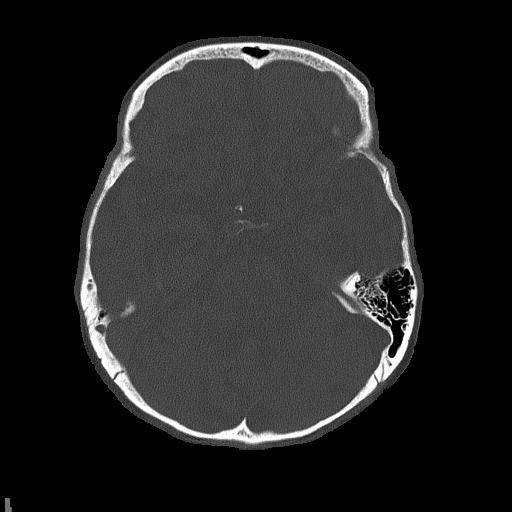
[im 20/67  bone]
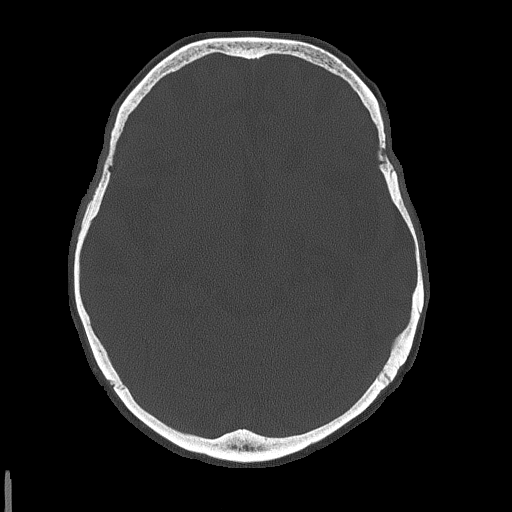

[Series 3: head wo · axial · 0.41mm/px · z∈[-121,-26]mm · 7 of 27 slices shown, 9 images]
[im 4/27  brain]
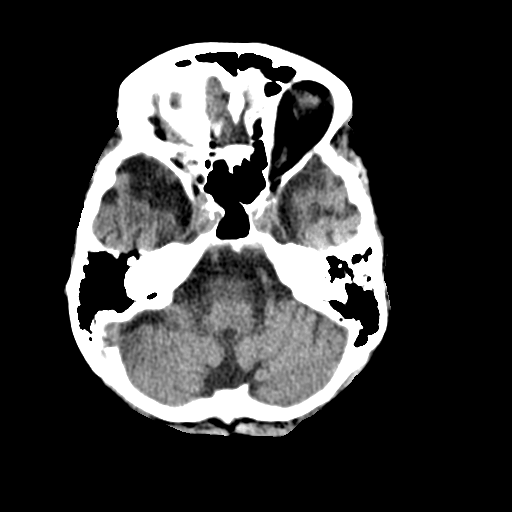
[im 4/27  bone]
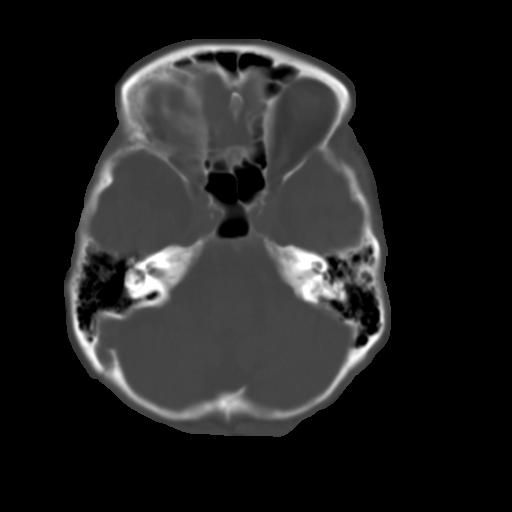
[im 7/27  brain]
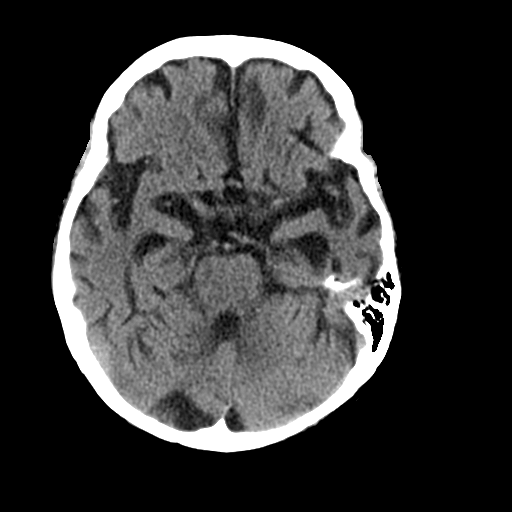
[im 10/27  brain]
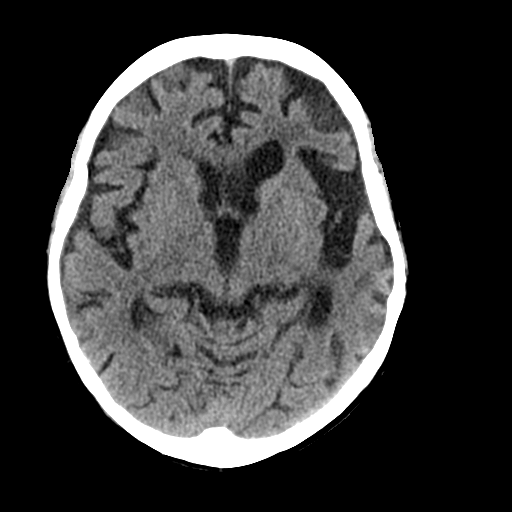
[im 14/27  brain]
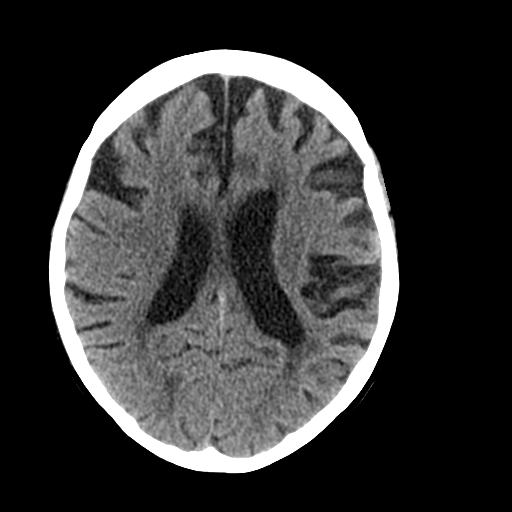
[im 17/27  brain]
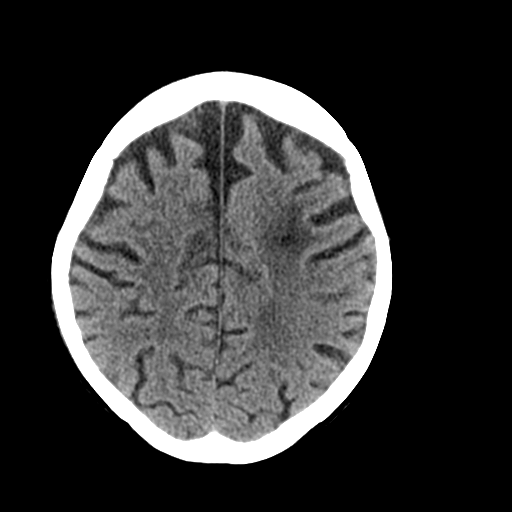
[im 17/27  bone]
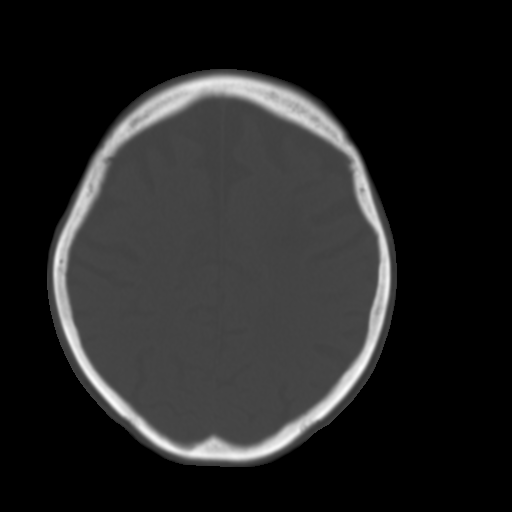
[im 20/27  brain]
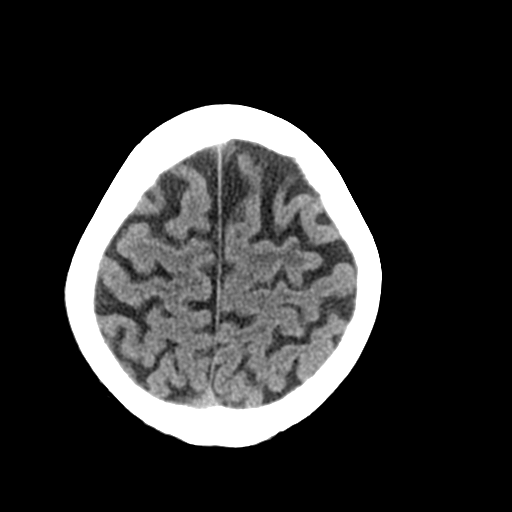
[im 23/27  brain]
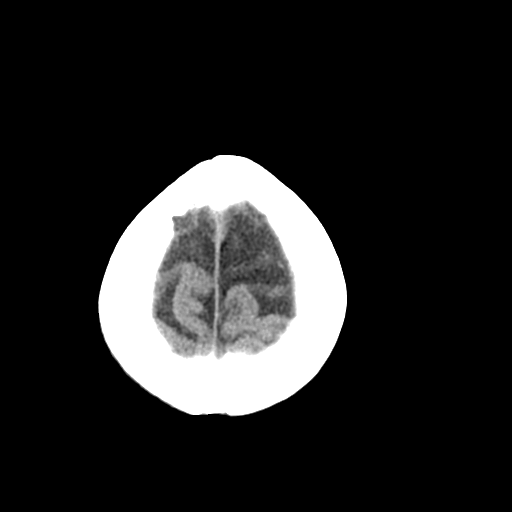

[Series 4: coronal soft tissue · coronal · 0.28mm/px · 3 of 61 slices shown]
[im 21/61  brain]
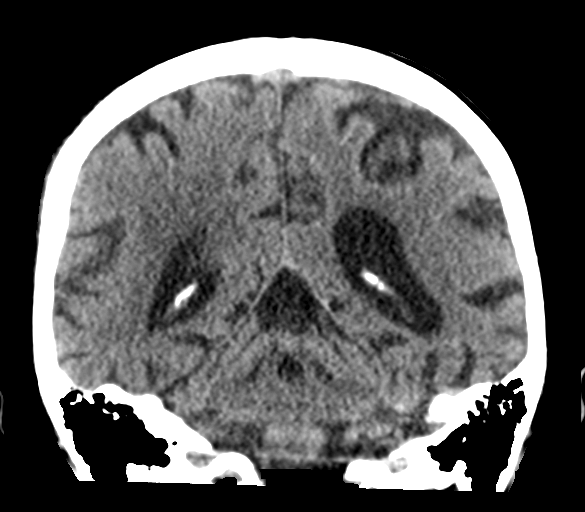
[im 27/61  brain]
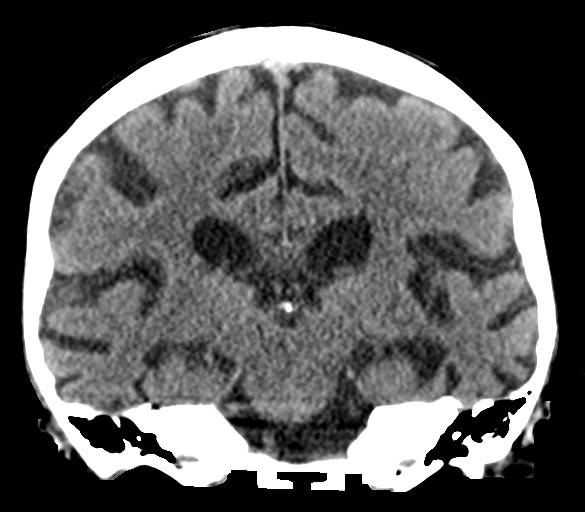
[im 34/61  brain]
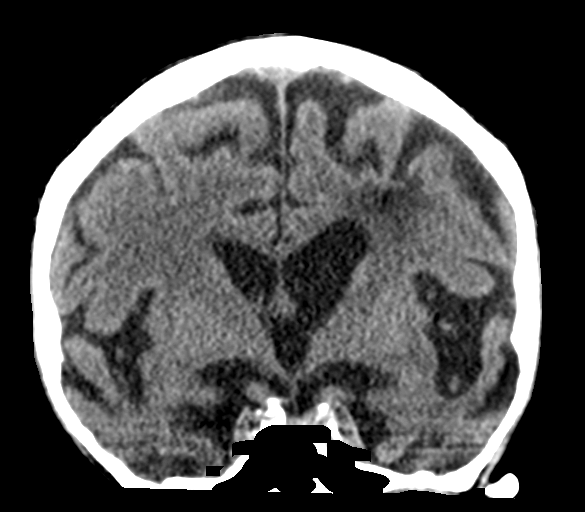

[Series 5: sagittal soft tissue · sagittal · 0.28mm/px · 3 of 54 slices shown]
[im 18/54  brain]
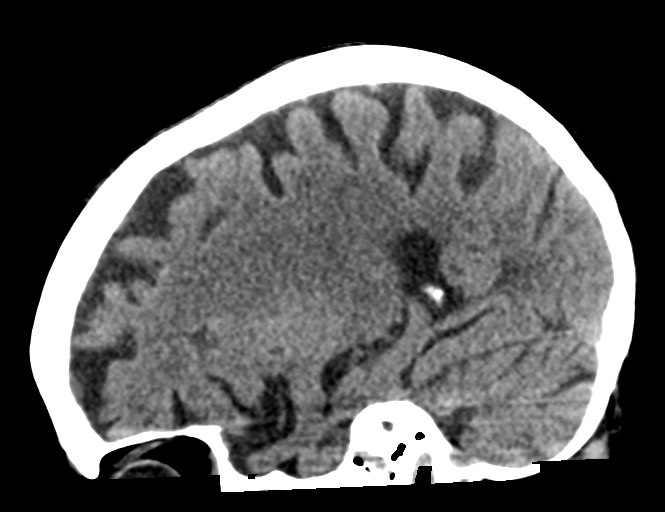
[im 27/54  brain]
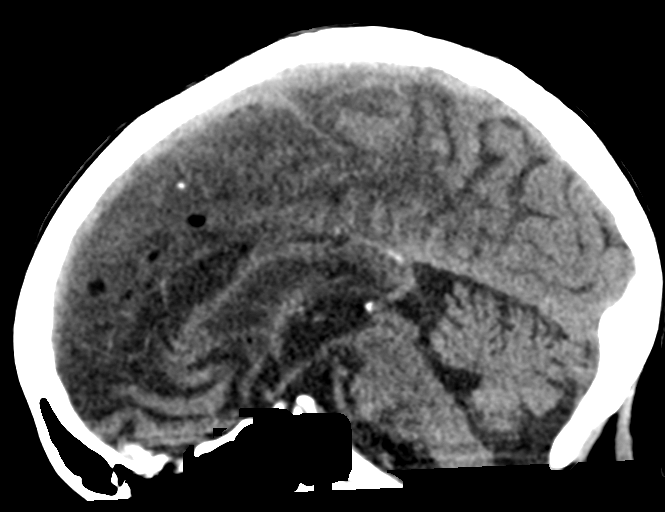
[im 36/54  brain]
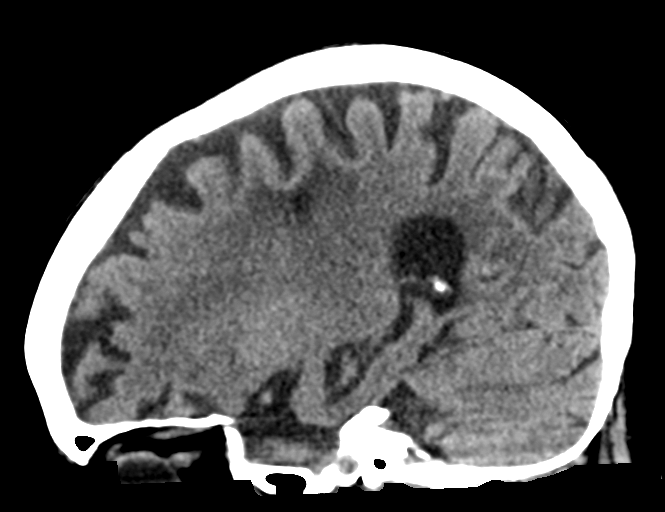

[16 of 47 positions shown; findings below may reference images not displayed]

FINDINGS: CT HEAD FINDINGS

Brain: No evidence of acute infarction, hemorrhage, hydrocephalus,
extra-axial collection or mass lesion/mass effect. Periventricular
and deep white matter hypodensity with areas of encephalomalacia of
the left frontal lobe.

Vascular: No hyperdense vessel or unexpected calcification.

Skull: Normal. Negative for fracture or focal lesion.

Sinuses/Orbits: No acute finding.

Other: None.

CT CERVICAL SPINE FINDINGS

Alignment: Degenerative straightening of the normal cervical
lordosis.

Skull base and vertebrae: No acute fracture. No primary bone lesion
or focal pathologic process.

Soft tissues and spinal canal: No prevertebral fluid or swelling. No
visible canal hematoma.

Disc levels: Moderate multilevel disc space height loss and
osteophytosis of the lower cervical spine.

Upper chest: Negative.

Other: None.
IMPRESSION: 1.  No acute intracranial pathology.

2. Small-vessel white matter disease and nonacute encephalomalacia
of the left frontal lobe in keeping with prior infarction.

3.  No fracture or static subluxation of the cervical spine.

4.  Moderate multilevel disc degenerative disease.

## 2020-07-01 ENCOUNTER — Ambulatory Visit: Payer: Medicare Other | Admitting: Podiatry

## 2020-10-25 DEATH — deceased
